# Patient Record
Sex: Male | Born: 1965
Health system: Southern US, Community
[De-identification: ages and names within clinical notes are randomized; demographics above are authoritative.]

## PROBLEM LIST (undated history)

## (undated) DIAGNOSIS — R0981 Nasal congestion: Secondary | ICD-10-CM

## (undated) HISTORY — DX: Other hemochromatosis: E83.118

## (undated) HISTORY — DX: Nasal congestion: R09.81

---

## 2016-02-25 DIAGNOSIS — M25511 Pain in right shoulder: Secondary | ICD-10-CM | POA: Diagnosis not present

## 2016-04-07 DIAGNOSIS — M7541 Impingement syndrome of right shoulder: Secondary | ICD-10-CM | POA: Diagnosis not present

## 2016-04-26 DIAGNOSIS — M25511 Pain in right shoulder: Secondary | ICD-10-CM | POA: Diagnosis not present

## 2016-04-30 DIAGNOSIS — M7541 Impingement syndrome of right shoulder: Secondary | ICD-10-CM | POA: Diagnosis not present

## 2017-04-19 DIAGNOSIS — L0292 Furuncle, unspecified: Secondary | ICD-10-CM | POA: Diagnosis not present

## 2017-04-19 DIAGNOSIS — Z1211 Encounter for screening for malignant neoplasm of colon: Secondary | ICD-10-CM | POA: Diagnosis not present

## 2017-07-06 DIAGNOSIS — J329 Chronic sinusitis, unspecified: Secondary | ICD-10-CM | POA: Diagnosis not present

## 2018-01-04 DIAGNOSIS — D235 Other benign neoplasm of skin of trunk: Secondary | ICD-10-CM | POA: Diagnosis not present

## 2018-01-04 DIAGNOSIS — B078 Other viral warts: Secondary | ICD-10-CM | POA: Diagnosis not present

## 2018-01-04 DIAGNOSIS — D485 Neoplasm of uncertain behavior of skin: Secondary | ICD-10-CM | POA: Diagnosis not present

## 2018-01-04 DIAGNOSIS — L819 Disorder of pigmentation, unspecified: Secondary | ICD-10-CM | POA: Diagnosis not present

## 2018-06-20 DIAGNOSIS — Z1211 Encounter for screening for malignant neoplasm of colon: Secondary | ICD-10-CM | POA: Diagnosis not present

## 2018-06-20 DIAGNOSIS — Z0000 Encounter for general adult medical examination without abnormal findings: Secondary | ICD-10-CM | POA: Diagnosis not present

## 2018-06-20 DIAGNOSIS — Z125 Encounter for screening for malignant neoplasm of prostate: Secondary | ICD-10-CM | POA: Diagnosis not present

## 2018-06-20 DIAGNOSIS — R6882 Decreased libido: Secondary | ICD-10-CM | POA: Diagnosis not present

## 2018-06-29 ENCOUNTER — Other Ambulatory Visit: Payer: Self-pay | Admitting: Family Medicine

## 2018-06-29 DIAGNOSIS — R7989 Other specified abnormal findings of blood chemistry: Secondary | ICD-10-CM

## 2018-06-29 DIAGNOSIS — R945 Abnormal results of liver function studies: Principal | ICD-10-CM

## 2018-07-10 ENCOUNTER — Ambulatory Visit
Admission: RE | Admit: 2018-07-10 | Discharge: 2018-07-10 | Disposition: A | Discharge status: Home / Self Care | Payer: BLUE CROSS/BLUE SHIELD | Source: Ambulatory Visit | Attending: Family Medicine | Admitting: Family Medicine

## 2018-07-10 DIAGNOSIS — R7989 Other specified abnormal findings of blood chemistry: Secondary | ICD-10-CM | POA: Diagnosis not present

## 2018-07-10 DIAGNOSIS — R945 Abnormal results of liver function studies: Principal | ICD-10-CM

## 2018-07-27 DIAGNOSIS — R7989 Other specified abnormal findings of blood chemistry: Secondary | ICD-10-CM | POA: Diagnosis not present

## 2018-07-27 DIAGNOSIS — R6882 Decreased libido: Secondary | ICD-10-CM | POA: Diagnosis not present

## 2018-07-27 DIAGNOSIS — R945 Abnormal results of liver function studies: Secondary | ICD-10-CM | POA: Diagnosis not present

## 2018-07-27 DIAGNOSIS — R7309 Other abnormal glucose: Secondary | ICD-10-CM | POA: Diagnosis not present

## 2018-08-11 ENCOUNTER — Telehealth: Payer: Self-pay | Admitting: Internal Medicine

## 2018-08-11 NOTE — Telephone Encounter (Signed)
Received a hem referral from Dr. London Pepper for eval hemochromatosis. Pt has been cld and scheduled to see Dr. Walden Field on 3/26 at 8:50am. Pt aware to arrive 15 minutes early.

## 2018-08-17 ENCOUNTER — Inpatient Hospital Stay: Payer: BLUE CROSS/BLUE SHIELD

## 2018-08-17 ENCOUNTER — Encounter: Payer: Self-pay | Admitting: Internal Medicine

## 2018-08-17 ENCOUNTER — Telehealth: Payer: Self-pay | Admitting: Internal Medicine

## 2018-08-17 ENCOUNTER — Inpatient Hospital Stay: Payer: BLUE CROSS/BLUE SHIELD | Attending: Internal Medicine | Admitting: Internal Medicine

## 2018-08-17 ENCOUNTER — Other Ambulatory Visit: Payer: Self-pay

## 2018-08-17 VITALS — BP 130/77 | HR 63 | Temp 97.6°F | Resp 17 | Ht 70.5 in | Wt 167.5 lb

## 2018-08-17 DIAGNOSIS — R899 Unspecified abnormal finding in specimens from other organs, systems and tissues: Secondary | ICD-10-CM

## 2018-08-17 DIAGNOSIS — R7989 Other specified abnormal findings of blood chemistry: Secondary | ICD-10-CM

## 2018-08-17 DIAGNOSIS — R0981 Nasal congestion: Secondary | ICD-10-CM | POA: Diagnosis not present

## 2018-08-17 LAB — CBC WITH DIFFERENTIAL (CANCER CENTER ONLY)
Abs Immature Granulocytes: 0.01 10*3/uL (ref 0.00–0.07)
Basophils Absolute: 0 10*3/uL (ref 0.0–0.1)
Basophils Relative: 1 %
Eosinophils Absolute: 0.2 10*3/uL (ref 0.0–0.5)
Eosinophils Relative: 3 %
HCT: 41.4 % (ref 39.0–52.0)
Hemoglobin: 13.9 g/dL (ref 13.0–17.0)
Immature Granulocytes: 0 %
LYMPHS ABS: 1.5 10*3/uL (ref 0.7–4.0)
Lymphocytes Relative: 28 %
MCH: 32.8 pg (ref 26.0–34.0)
MCHC: 33.6 g/dL (ref 30.0–36.0)
MCV: 97.6 fL (ref 80.0–100.0)
Monocytes Absolute: 0.4 10*3/uL (ref 0.1–1.0)
Monocytes Relative: 7 %
NRBC: 0 % (ref 0.0–0.2)
Neutro Abs: 3.2 10*3/uL (ref 1.7–7.7)
Neutrophils Relative %: 61 %
Platelet Count: 168 10*3/uL (ref 150–400)
RBC: 4.24 MIL/uL (ref 4.22–5.81)
RDW: 12.5 % (ref 11.5–15.5)
WBC Count: 5.3 10*3/uL (ref 4.0–10.5)

## 2018-08-17 LAB — CMP (CANCER CENTER ONLY)
ALT: 79 U/L — AB (ref 0–44)
AST: 78 U/L — ABNORMAL HIGH (ref 15–41)
Albumin: 4.4 g/dL (ref 3.5–5.0)
Alkaline Phosphatase: 68 U/L (ref 38–126)
Anion gap: 8 (ref 5–15)
BUN: 19 mg/dL (ref 6–20)
CHLORIDE: 103 mmol/L (ref 98–111)
CO2: 29 mmol/L (ref 22–32)
Calcium: 9.7 mg/dL (ref 8.9–10.3)
Creatinine: 0.79 mg/dL (ref 0.61–1.24)
GFR, Est AFR Am: >60 mL/min (ref >60)
GFR, Estimated: >60 mL/min (ref >60)
Glucose, Bld: 100 mg/dL — ABNORMAL HIGH (ref 70–99)
Potassium: 4.3 mmol/L (ref 3.5–5.1)
SODIUM: 140 mmol/L (ref 135–145)
Total Bilirubin: 1.6 mg/dL — ABNORMAL HIGH (ref 0.3–1.2)
Total Protein: 7.2 g/dL (ref 6.5–8.1)

## 2018-08-17 LAB — IRON AND TIBC
Iron: 234 ug/dL — ABNORMAL HIGH (ref 42–163)
Saturation Ratios: 105 % — ABNORMAL HIGH (ref 20–55)
TIBC: 223 ug/dL (ref 202–409)
UIBC: UNDETERMINED ug/dL (ref 117–376)

## 2018-08-17 LAB — FERRITIN: Ferritin: 5325 ng/mL — ABNORMAL HIGH (ref 24–336)

## 2018-08-17 LAB — LACTATE DEHYDROGENASE: LDH: 178 U/L (ref 98–192)

## 2018-08-17 NOTE — Progress Notes (Signed)
Referring Physician:  [facility] Diagnosis Abnormal laboratory test - Plan: CMP ([place] only), Lactate dehydrogenase (LDH), CBC with Differential ([place] Only), Iron and TIBC, Ferritin, Hemochromatosis DNA, PCR  Sinus congestion  Staging Cancer Staging No matching staging information was found for the patient.  Assessment and Plan:  1.  Abnormal lab studies.  53 year old male referred for evaluation due to elevated LFTs and iron studies.  Pt had labs done 07/27/2018 that showed iron level of 201 and % saturation of 96%.  Hep B and C panels was negative.  Chemistries WNL with Cr 0.88 and K+ 4.2 AST elevated at 88 and ALT elevated at 93.  Occult blood testing negative.  Pt denies any family history of hemochromatosis.  Pt had RUQ USN done 07/10/2018 that showed  IMPRESSION: Normal right upper quadrant ultrasound.  He denies fevers, chills, night sweats, SOB, Headache.  He remains active.  He takes supplements such as B complex and testerone booster.    Pt seen today for consultation due to elevated LFTs and elevated iron studies.    Labs done today in clinic reviewed and showed WBC 5.3 Hb 13.9 plts 168,000.  He has a normal differential.  Chemistries WNL with K+ 4.3 Cr 0.79.  LFTs are elevated with AST of 78 and ALT of 79.  Ferritin elevated at 5325.  TS is 105%.  Awaiting results of hemochromatosis lab evaluation.  If findings are consistent with hemochromatosis, pt will be recommended for Phlebotomy to decrease ferritin level 50-100.  He had recent imaging with USN done in 06/2018 that showed no liver abnormalities.  Discussion of hemachromatosis held with pt and he was provided written information regarding diagnostic evaluation and treatment.  He will be set up for phone meeting in 2 weeks to go over lab results.  Further recommendations will follow once hemochromatosis results reviewed.    2.  Elevated LFTs.  Pt reports he takes Tylenol sinus frequently.   Awaiting results of hemochromatosis labs.  May have to avoid extensive Tylenol use due to potential effects on liver.    3.  Sinus congestion.  Pt reports he takes Tylenol sinus frequently.  May have to avoid extensive Tylenol use due to potential effects on liver.    4.  Health maintenance.  Pt reports he has undergone Colo-guard testing.  Follow-up with PCP as directed.    40 minutes spent with more than 50% spent in review of records, counseling and coordination of care.    HPI:  53 year old male referred for evaluation due to elevated LFTs and iron studies.  Pt had labs done 07/27/2018 that showed iron level of 201 and % saturation of 96%.  Hep B and C panels was negative.  Chemistries WNL with Cr 0.88 and K+ 4.2 AST elevated at 88 and ALT elevated at 93.  Occult blood testing negative.  Pt denies any family history of hemochromatosis.  Pt had RUQ USN done 07/10/2018 that showed  IMPRESSION: Normal right upper quadrant ultrasound.  He denies fevers, chills, night sweats, SOB, Headache.  He remains active.  He takes supplements such as B complex and testerone booster.    Pt seen today for consultation due to elevated LFTs and elevated iron studies.    Problem List Patient Active Problem List   Diagnosis Date Noted  . Sinus congestion [R09.81]     Past Medical History Past Medical History:  Diagnosis Date  . Sinus congestion     Past  Surgical History History reviewed. No pertinent surgical history.  Family History Family History  Problem Relation Age of Onset  . Hemochromatosis Neg Hx      Social History  reports that he has never smoked. He has never used smokeless tobacco. He reports previous alcohol use. He reports previous drug use.  Medications  Current Outpatient Medications:  .  b complex vitamins tablet, Take 1 tablet by mouth daily., Disp: , Rfl:  .  fluticasone (FLONASE) 50 MCG/ACT nasal spray, Place 2 sprays into both nostrils daily., Disp: , Rfl:  .   guaiFENesin (MUCINEX) 600 MG 12 hr tablet, Take 600 mg by mouth 2 (two) times daily., Disp: , Rfl:  .  pseudoephedrine-acetaminophen (TYLENOL SINUS) 30-500 MG TABS tablet, Take 2 tablets by mouth every 4 (four) hours as needed., Disp: , Rfl:   Allergies Patient has no allergy information on record.  Review of Systems Review of Systems - Oncology ROS negative   Physical Exam  Vitals Wt Readings from Last 3 Encounters:  08/17/18 167 lb 8 oz (76 kg)   Temp Readings from Last 3 Encounters:  08/17/18 97.6 F (36.4 C) (Oral)   BP Readings from Last 3 Encounters:  08/17/18 130/77   Pulse Readings from Last 3 Encounters:  08/17/18 63   Constitutional: Well-developed, well-nourished, and in no distress.   HENT: Head: Normocephalic and atraumatic.  Mouth/Throat: No oropharyngeal exudate. Mucosa moist. Eyes: Pupils are equal, round, and reactive to light. Conjunctivae are normal. No scleral icterus.  Neck: Normal range of motion. Neck supple. No JVD present.  Cardiovascular: Normal rate, regular rhythm and normal heart sounds.  Exam reveals no gallop and no friction rub.   No murmur heard. Pulmonary/Chest: Effort normal and breath sounds normal. No respiratory distress. No wheezes.No rales.  Abdominal: Soft. Bowel sounds are normal. No distension. There is no tenderness. There is no guarding.  Musculoskeletal: No edema or tenderness.  Lymphadenopathy: No cervical,axillary or supraclavicular adenopathy.  Neurological: Alert and oriented to person, place, and time. No cranial nerve deficit.  Skin: Skin is warm and dry. No rash noted. No erythema. No pallor.  Psychiatric: Affect and judgment normal.   Labs Appointment on 08/17/2018  Component Date Value Ref Range Status  . Ferritin 08/17/2018 5,325* 24 - 336 ng/mL Final   Performed at [facility], [place] [address], [place], [place] [address]  . Iron 08/17/2018 234* 42 - 163 ug/dL Final  . TIBC 08/17/2018 223   202 - 409 ug/dL Final  . Saturation Ratios 08/17/2018 105* 20 - 55 % Final  . UIBC 08/17/2018 UNABLE TO CALCULATE  117 - 376 ug/dL Final   Performed at [facility], [place] [address], [place],  [address]  . WBC Count 08/17/2018 5.3  4.0 - 10.5 K/uL Final  . RBC 08/17/2018 4.24  4.22 - 5.81 MIL/uL Final  . Hemoglobin 08/17/2018 13.9  13.0 - 17.0 g/dL Final  . HCT 08/17/2018 41.4  39.0 - 52.0 % Final  . MCV 08/17/2018 97.6  80.0 - 100.0 fL Final  . MCH 08/17/2018 32.8  26.0 - 34.0 pg Final  . MCHC 08/17/2018 33.6  30.0 - 36.0 g/dL Final  . RDW 08/17/2018 12.5  11.5 - 15.5 % Final  . Platelet Count 08/17/2018 168  150 - 400 K/uL Final  . nRBC 08/17/2018 0.0  0.0 - 0.2 % Final  . Neutrophils Relative % 08/17/2018 61  % Final  . Neutro Abs 08/17/2018 3.2  1.7 - 7.7 K/uL Final  . Lymphocytes Relative 08/17/2018 28  % Final  . Lymphs Abs 08/17/2018 1.5  0.7 - 4.0 K/uL Final  . Monocytes Relative 08/17/2018 7  % Final  . Monocytes Absolute 08/17/2018 0.4  0.1 - 1.0 K/uL Final  . Eosinophils Relative 08/17/2018 3  % Final  . Eosinophils Absolute 08/17/2018 0.2  0.0 - 0.5 K/uL Final  . Basophils Relative 08/17/2018 1  % Final  . Basophils Absolute 08/17/2018 0.0  0.0 - 0.1 K/uL Final  . Immature Granulocytes 08/17/2018 0  % Final  . Abs Immature Granulocytes 08/17/2018 0.01  0.00 - 0.07 K/uL Final   Performed at [facility], [place] [address], [place], [place] [address]  . LDH 08/17/2018 178  98 - 192 U/L Final   Performed at [facility], [place] [address], [place], [place] [address]  . Sodium 08/17/2018 140  135 - 145 mmol/L Final  . Potassium 08/17/2018 4.3  3.5 - 5.1 mmol/L Final  . Chloride 08/17/2018 103  98 - 111 mmol/L Final  . CO2 08/17/2018 29  22 - 32 mmol/L Final  . Glucose, Bld 08/17/2018 100* 70 - 99 mg/dL Final  . BUN 08/17/2018 19  6 - 20 mg/dL Final  . Creatinine 08/17/2018 0.79  0.61 - 1.24 mg/dL  Final  . Calcium 08/17/2018 9.7  8.9 - 10.3 mg/dL Final  . Total Protein 08/17/2018 7.2  6.5 - 8.1 g/dL Final  . Albumin 08/17/2018 4.4  3.5 - 5.0 g/dL Final  . AST 08/17/2018 78* 15 - 41 U/L Final  . ALT 08/17/2018 79* 0 - 44 U/L Final  . Alkaline Phosphatase 08/17/2018 68  38 - 126 U/L Final  . Total Bilirubin 08/17/2018 1.6* 0.3 - 1.2 mg/dL Final  . GFR, Est Non Af Am 08/17/2018 >60  >60 mL/min Final  . GFR, Est AFR Am 08/17/2018 >60  >60 mL/min Final  . Anion gap 08/17/2018 8  5 - 15 Final   Performed at [facility], [place] [address], [place],  [address]     Pathology Orders Placed This Encounter  Procedures  . CMP ([place] only)    Standing Status:   Future    Number of Occurrences:   1    Standing Expiration Date:   08/17/2019  . Lactate dehydrogenase (LDH)    Standing Status:   Future    Number of Occurrences:   1    Standing Expiration Date:   08/17/2019  . CBC with Differential ([facility] Only)    Standing Status:   Future    Number of Occurrences:   1    Standing Expiration Date:   08/17/2019  . Iron and TIBC    Standing Status:   Future    Number of Occurrences:   1    Standing Expiration Date:   08/17/2019  . Ferritin    Standing Status:   Future    Number of Occurrences:   1    Standing Expiration Date:   08/17/2019  . Hemochromatosis DNA, PCR    Standing Status:   Future    Number of Occurrences:   1    Standing Expiration Date:   08/17/2019       Zoila Shutter MD

## 2018-08-17 NOTE — Telephone Encounter (Signed)
Scheduled appt per 3/26 los.  Virtual visit was not an option in the appt type anymore.  Put est for the visit type and typed in the notes that it was a phone visit.

## 2018-08-23 LAB — HEMOCHROMATOSIS DNA-PCR(C282Y,H63D)

## 2018-08-31 ENCOUNTER — Ambulatory Visit: Payer: BLUE CROSS/BLUE SHIELD | Admitting: Internal Medicine

## 2018-08-31 ENCOUNTER — Inpatient Hospital Stay: Payer: BLUE CROSS/BLUE SHIELD | Attending: Internal Medicine | Admitting: Internal Medicine

## 2018-08-31 DIAGNOSIS — R0981 Nasal congestion: Secondary | ICD-10-CM | POA: Insufficient documentation

## 2018-08-31 NOTE — Progress Notes (Signed)
Virtual Visit via Telephone Note  I connected with Keith Evans on 08/31/18 at  9:50 AM EDT by telephone and verified that I am speaking with the correct person using two identifiers.   I discussed the limitations, risks, security and privacy concerns of performing an evaluation and management service by telephone and the availability of in person appointments. I also discussed with the patient that there may be a patient responsible charge related to this service. The patient expressed understanding and agreed to proceed.   Interval History: Historical data obtained from note dated 08/17/2018  53 year old male referred for evaluation due to elevated LFTs and iron studies.  Pt had labs done 07/27/2018 that showed iron level of 201 and % saturation of 96%.  Hep B and C panels was negative.  Chemistries WNL with Cr 0.88 and K+ 4.2 AST elevated at 88 and ALT elevated at 93.  Occult blood testing negative.  Pt denies any family history of hemochromatosis.  Pt had RUQ USN done 07/10/2018 that showed  IMPRESSION: Normal right upper quadrant ultrasound.  He denies fevers, chills, night sweats, SOB, Headache.  He remains active.  He takes supplements such as B complex and testerone booster.  Pt denies family history of hemochromatosis.  He reports father was told he had NASH.     Observations/Objective:  Review labs from 08/17/2018   Assessment and Plan:1.  Hemochromatosis, C282Y homozygote.  53 year old male referred for evaluation due to elevated LFTs and iron studies.  Pt had labs done 07/27/2018 that showed iron level of 201 and % saturation of 96%.  Hep B and C panels was negative.  Chemistries WNL with Cr 0.88 and K+ 4.2 AST elevated at 88 and ALT elevated at 93.  Occult blood testing negative.  Pt denies any family history of hemochromatosis.  Pt had RUQ USN done 07/10/2018 that showed  IMPRESSION: Normal right upper quadrant ultrasound.  He denies fevers, chills, night sweats, SOB, Headache.  He  remains active.  He takes supplements such as B complex and testerone booster.    Pt seen today for consultation due to elevated LFTs and elevated iron studies.    Labs done 08/17/2018  reviewed and showed WBC 5.3 Hb 13.9 plts 168,000.  He has a normal differential.  Chemistries WNL with K+ 4.3 Cr 0.79.  LFTs are elevated with AST of 78 and ALT of 79.  Ferritin elevated at 5325.  TS is 105%.  Hemochromatosis gene evaluation returned  Result: AFFECTED  Two copies of the same mutation (C282Y and C282Y) identified  Interpretation:  Results for H63D and S65C were negative.   Based on the pt being homozygous for C282Y and with ferritin level of greater than 5000, he is recommended for Phlebotomy to decrease ferritin level 50-100.  He had recent imaging with USN done in 06/2018 that showed no liver abnormalities.  Previously, I discussed diagnosis and potential clinical implications of hemachromatosis.  Pt was also provided written information.  Pt is set up for 1 unit (500 ML) phlebotomy weekly for 4 weeks.  He will have labs repeated in May 2020.  Pt reports he has a brother and he is advised to have him get iron studies for testing.  Pt reports no known family history of hemochromatosis but does report his father reportedly had NASH.    2.  Elevated LFTs. Pt is homozygous for hemochromatosis.  Recent USN of abdomen done 06/2018 showed no liver abnormalities.   Pt reports  he takes Tylenol sinus frequently. May have to avoid extensive Tylenol use due to potential effects on liver.  Pt advised to avoid extensive supplement use.  Will repeat labs in 09/2018 for ongoing follow-up.    3.  Family history of NASH.  Pt reports his father reportedly was diagnosed with this.  He reports he died > 44 yoa..    4.  Sinus congestion.  Pt reports he takes Tylenol sinus frequently.  May have to avoid extensive Tylenol use due to potential effects on liver.    5.  Health maintenance.  Pt reports he has undergone  Colo-guard testing.  Follow-up with PCP as directed.    Follow Up Instructions:   Weekly 1 unit phlebotomy for 4 weeks.  Labs in 09/2018 and follow-up at that time.    I discussed the assessment and treatment plan with the patient. The patient was provided an opportunity to ask questions and all were answered. The patient agreed with the plan and demonstrated an understanding of the instructions.   The patient was advised to call back or seek an in-person evaluation if the symptoms worsen or if the condition fails to improve as anticipated.  I provided 15 minutes of non-face-to-face time during this encounter.   Zoila Shutter, MD

## 2018-09-01 ENCOUNTER — Telehealth: Payer: Self-pay | Admitting: Internal Medicine

## 2018-09-01 NOTE — Telephone Encounter (Signed)
Called regarding 4/16 °

## 2018-09-07 ENCOUNTER — Other Ambulatory Visit: Payer: Self-pay | Admitting: Internal Medicine

## 2018-09-07 ENCOUNTER — Inpatient Hospital Stay: Payer: BLUE CROSS/BLUE SHIELD

## 2018-09-07 ENCOUNTER — Other Ambulatory Visit: Payer: Self-pay

## 2018-09-07 ENCOUNTER — Encounter: Payer: Self-pay | Admitting: Internal Medicine

## 2018-09-07 DIAGNOSIS — R0981 Nasal congestion: Secondary | ICD-10-CM | POA: Diagnosis not present

## 2018-09-07 HISTORY — DX: Other hemochromatosis: E83.118

## 2018-09-07 NOTE — Progress Notes (Signed)
Pt in for phlebotomy. 18 G needle with secondary tubing kit used.  Start time of [phone] and end time of 906. 514cc removed. Pt tolerated well. Monitored for 30 minutes post procedure, and VSS upon leaving unit.  Pt provided with education on post phlebotomy care and given handout.

## 2018-09-07 NOTE — Patient Instructions (Signed)

## 2018-09-14 ENCOUNTER — Other Ambulatory Visit: Payer: Self-pay

## 2018-09-14 ENCOUNTER — Inpatient Hospital Stay: Payer: BLUE CROSS/BLUE SHIELD

## 2018-09-14 DIAGNOSIS — R0981 Nasal congestion: Secondary | ICD-10-CM | POA: Diagnosis not present

## 2018-09-14 NOTE — Patient Instructions (Signed)
Coronavirus (COVID-19) Are you at risk?  Are you at risk for the Coronavirus (COVID-19)?  To be considered HIGH RISK for Coronavirus (COVID-19), you have to meet the following criteria:  . Traveled to [place], [place], [place], [place] or [place]; or in the [place] to [place], [place], [place], or [place]; and have fever, cough, and shortness of breath within the last 2 weeks of travel OR . Been in close contact with a person diagnosed with COVID-19 within the last 2 weeks and have fever, cough, and shortness of breath . IF YOU DO NOT MEET THESE CRITERIA, YOU ARE CONSIDERED LOW RISK FOR COVID-19.  What to do if you are HIGH RISK for COVID-19?  . If you are having a medical emergency, call 911. . Seek medical care right away. Before you go to a doctor's office, urgent care or emergency department, call ahead and tell them about your recent travel, contact with someone diagnosed with COVID-19, and your symptoms. You should receive instructions from your physician's office regarding next steps of care.  . When you arrive at healthcare provider, tell the healthcare staff immediately you have returned from visiting [place], [place], [place], [place] or [place]; or traveled in the [place] to [place], [place], [place], or [place]; in the last two weeks or you have been in close contact with a person diagnosed with COVID-19 in the last 2 weeks.   . Tell the health care staff about your symptoms: fever, cough and shortness of breath. . After you have been seen by a medical provider, you will be either: o Tested for (COVID-19) and discharged home on quarantine except to seek medical care if symptoms worsen, and asked to  - Stay home and avoid contact with others until you get your results (4-5 days)  - Avoid travel on public transportation if possible (such as bus, train, or airplane) or o Sent to the Emergency Department by EMS for evaluation, COVID-19 testing, and possible  admission depending on your condition and test results.  What to do if you are LOW RISK for COVID-19?  Reduce your risk of any infection by using the same precautions used for avoiding the common cold or flu:  . Wash your hands often with soap and warm water for at least 20 seconds.  If soap and water are not readily available, use an alcohol-based hand sanitizer with at least 60% alcohol.  . If coughing or sneezing, cover your mouth and nose by coughing or sneezing into the elbow areas of your shirt or coat, into a tissue or into your sleeve (not your hands). . Avoid shaking hands with others and consider head nods or verbal greetings only. . Avoid touching your eyes, nose, or mouth with unwashed hands.  . Avoid close contact with people who are sick. . Avoid places or events with large numbers of people in one location, like concerts or sporting events. . Carefully consider travel plans you have or are making. . If you are planning any travel outside or inside the [place], visit the CDC's Travelers' Health webpage for the latest health notices. . If you have some symptoms but not all symptoms, continue to monitor at home and seek medical attention if your symptoms worsen. . If you are having a medical emergency, call 911.   ADDITIONAL HEALTHCARE OPTIONS FOR PATIENTS  [facility] / e-Visit: [email or web address]         [facility]: [phone]  Onset   Urgent Care: [facility]: [phone]   Therapeutic Phlebotomy, Care After This sheet gives you information about how to care for yourself after your procedure. Your health care provider may also give you more specific instructions. If you have problems or questions, contact your health care provider. What can I expect after the procedure? After the procedure, it is common to have:  Light-headedness or dizziness. You may feel  faint.  Nausea.  Tiredness (fatigue). Follow these instructions at home: Eating and drinking  Be sure to eat well-balanced meals for the next 24 hours.  Drink enough fluid to keep your urine pale yellow.  Avoid drinking alcohol on the day that you had the procedure. Activity   Return to your normal activities as told by your health care provider. Most people can go back to their normal activities right away.  Avoid activities that take a lot of effort for about 5 hours after the procedure. Athletes should avoid strenuous exercise for at least 12 hours.  Avoid heavy lifting or pulling for about 5 hours after the procedure. Do not lift anything that is heavier than 10 lb (4.5 kg).  Change positions slowly for the remainder of the day. This will help to prevent light-headedness or fainting.  If you feel light-headed, lie down until the feeling goes away. Needle insertion site care   Keep your bandage (dressing) dry. You can remove the bandage after about 5 hours or as told by your health care provider.  If you have bleeding from the needle insertion site, raise (elevate) your arm and press firmly on the site until the bleeding stops.  If you have bruising at the site, apply ice to the area: ? Remove the dressing. ? Put ice in a plastic bag. ? Place a towel between your skin and the bag. ? Leave the ice on for 20 minutes, 2-3 times a day for the first 24 hours.  If the swelling does not go away after 24 hours, apply a warm, moist cloth (warm compress) to the area for 20 minutes, 2-3 times a day. General instructions  Do not use any products that contain nicotine or tobacco, such as cigarettes and e-cigarettes, for at least 30 minutes after the procedure.  Keep all follow-up visits as told by your health care provider. This is important. You may need to continue having regular therapeutic phlebotomy treatments as directed. Contact a health care provider if you:  Have redness,  swelling, or pain at the needle insertion site.  Have fluid or blood coming from the needle insertion site.  Have pus or a bad smell coming from the needle insertion site.  Notice that the needle insertion site feels warm to the touch.  Feel light-headed, dizzy, or nauseous, and the feeling does not go away.  Have new bruising at the needle insertion site.  Feel weaker than normal.  Have a fever or chills. Get help right away if:  You faint.  You have chest pain.  You have trouble breathing.  You have severe nausea or vomiting. Summary  After the procedure, it is common to have some light-headedness, dizziness, nausea, or tiredness (fatigue).  Be sure to eat well-balanced meals for the next 24 hours. Drink enough fluid to keep your urine pale yellow.  Return to your normal activities as told by your health care provider.  Keep all follow-up visits as  told by your health care provider. You may need to continue having regular therapeutic phlebotomy treatments as directed. This information is not intended to replace advice given to you by your health care provider. Make sure you discuss any questions you have with your health care provider. Document Released: 10/12/2010 Document Revised: 05/26/2017 Document Reviewed: 05/26/2017 Elsevier Interactive Patient Education  2019 [employer].

## 2018-09-14 NOTE — Progress Notes (Signed)
Keith Evans presents today for phlebotomy per MD orders. LFA accessed with 20G needle and secondary set by Amy S-RN (unable to get good blood flow from RAC with phlebotomy kit or LW with 18G). Phlebotomy procedure started at 08:51 and ended at 09:12. 506 grams removed. IV needle removed intact. Patient tolerated procedure well. Snack and drink provided and patient observed for 30 minutes after procedure. VSS and patient ambulated out of clinic without any incident.

## 2018-09-21 ENCOUNTER — Inpatient Hospital Stay: Payer: BLUE CROSS/BLUE SHIELD

## 2018-09-21 ENCOUNTER — Other Ambulatory Visit: Payer: Self-pay

## 2018-09-21 DIAGNOSIS — R0981 Nasal congestion: Secondary | ICD-10-CM | POA: Diagnosis not present

## 2018-09-21 NOTE — Progress Notes (Signed)
Therapeutic phlebotomy performed per Dr. Walden Field orders. Started at [phone] and ended at 28. 18 gauge needle used x 1 attempt left anterior forearm. Patient tolerated well. 500 grams removed. Patient offered beverage and snacks and accepted beverage. 4x4 and coban applied. No active bleeding noted.

## 2018-09-21 NOTE — Patient Instructions (Signed)
Therapeutic Phlebotomy Therapeutic phlebotomy is the planned removal of blood from a person's body for the purpose of treating a medical condition. The procedure is similar to donating blood. Usually, about a pint (470 mL, or 0.47 L) of blood is removed. The average adult has 9-12 pints (4.3-5.7 L) of blood in the body. Therapeutic phlebotomy may be used to treat the following medical conditions:  Hemochromatosis. This is a condition in which the blood contains too much iron.  Polycythemia vera. This is a condition in which the blood contains too many red blood cells.  Porphyria cutanea tarda. This is a disease in which an important part of hemoglobin is not made properly. It results in the buildup of abnormal amounts of porphyrins in the body.  Sickle cell disease. This is a condition in which the red blood cells form an abnormal crescent shape rather than a round shape. Tell a health care provider about:  Any allergies you have.  All medicines you are taking, including vitamins, herbs, eye drops, creams, and over-the-counter medicines.  Any problems you or family members have had with anesthetic medicines.  Any blood disorders you have.  Any surgeries you have had.  Any medical conditions you have.  Whether you are pregnant or may be pregnant. What are the risks? Generally, this is a safe procedure. However, problems may occur, including:  Nausea or light-headedness.  Low blood pressure (hypotension).  Soreness, bleeding, swelling, or bruising at the needle insertion site.  Infection. What happens before the procedure?  Follow instructions from your health care provider about eating or drinking restrictions.  Ask your health care provider about: ? Changing or stopping your regular medicines. This is especially important if you are taking diabetes medicines or blood thinners (anticoagulants). ? Taking medicines such as aspirin and ibuprofen. These medicines can thin your  blood. Do not take these medicines unless your health care provider tells you to take them. ? Taking over-the-counter medicines, vitamins, herbs, and supplements.  Wear clothing with sleeves that can be raised above the elbow.  Plan to have someone take you home from the hospital or clinic.  You may have a blood sample taken.  Your blood pressure, pulse rate, and breathing rate will be measured. What happens during the procedure?   To lower your risk of infection: ? Your health care team will wash or sanitize their hands. ? Your skin will be cleaned with an antiseptic.  You may be given a medicine to numb the area (local anesthetic).  A tourniquet will be placed on your arm.  A needle will be inserted into one of your veins.  Tubing and a collection bag will be attached to that needle.  Blood will flow through the needle and tubing into the collection bag.  The collection bag will be placed lower than your arm to allow gravity to help the flow of blood into the bag.  You may be asked to open and close your hand slowly and continually during the entire collection.  After the specified amount of blood has been removed from your body, the collection bag and tubing will be clamped.  The needle will be removed from your vein.  Pressure will be held on the site of the needle insertion to stop the bleeding.  A bandage (dressing) will be placed over the needle insertion site. The procedure may vary among health care providers and hospitals. What happens after the procedure?  Your blood pressure, pulse rate, and breathing rate will be   measured after the procedure.  You will be encouraged to drink fluids.  Your recovery will be assessed and monitored.  You can return to your normal activities as told by your health care provider. Summary  Therapeutic phlebotomy is the planned removal of blood from a person's body for the purpose of treating a medical condition.  Therapeutic  phlebotomy may be used to treat hemochromatosis, polycythemia vera, porphyria cutanea tarda, or sickle cell disease.  In the procedure, a needle is inserted and about a pint (470 mL, or 0.47 L) of blood is removed. The average adult has 9-12 pints (4.3-5.7 L) of blood in the body.  This is generally a safe procedure, but it can sometimes cause problems such as nausea, light-headedness, or low blood pressure (hypotension). This information is not intended to replace advice given to you by your health care provider. Make sure you discuss any questions you have with your health care provider. Document Released: 10/12/2010 Document Revised: 05/26/2017 Document Reviewed: 05/26/2017 Elsevier Interactive Patient Education  2019 Elsevier Inc.  

## 2018-09-28 ENCOUNTER — Inpatient Hospital Stay: Payer: BLUE CROSS/BLUE SHIELD | Attending: Internal Medicine

## 2018-09-28 ENCOUNTER — Other Ambulatory Visit: Payer: Self-pay

## 2018-09-28 ENCOUNTER — Inpatient Hospital Stay (HOSPITAL_BASED_OUTPATIENT_CLINIC_OR_DEPARTMENT_OTHER): Payer: BLUE CROSS/BLUE SHIELD | Admitting: Internal Medicine

## 2018-09-28 ENCOUNTER — Telehealth: Payer: Self-pay | Admitting: Internal Medicine

## 2018-09-28 ENCOUNTER — Inpatient Hospital Stay: Payer: BLUE CROSS/BLUE SHIELD

## 2018-09-28 DIAGNOSIS — R0981 Nasal congestion: Secondary | ICD-10-CM | POA: Diagnosis not present

## 2018-09-28 LAB — CBC WITH DIFFERENTIAL (CANCER CENTER ONLY)
Abs Immature Granulocytes: 0 10*3/uL (ref 0.00–0.07)
Basophils Absolute: 0.1 10*3/uL (ref 0.0–0.1)
Basophils Relative: 1 %
Eosinophils Absolute: 0.2 10*3/uL (ref 0.0–0.5)
Eosinophils Relative: 4 %
HCT: 38.6 % — ABNORMAL LOW (ref 39.0–52.0)
Hemoglobin: 13 g/dL (ref 13.0–17.0)
Immature Granulocytes: 0 %
Lymphocytes Relative: 28 %
Lymphs Abs: 1.4 10*3/uL (ref 0.7–4.0)
MCH: 33.2 pg (ref 26.0–34.0)
MCHC: 33.7 g/dL (ref 30.0–36.0)
MCV: 98.5 fL (ref 80.0–100.0)
Monocytes Absolute: 0.4 10*3/uL (ref 0.1–1.0)
Monocytes Relative: 8 %
Neutro Abs: 3 10*3/uL (ref 1.7–7.7)
Neutrophils Relative %: 59 %
Platelet Count: 184 10*3/uL (ref 150–400)
RBC: 3.92 MIL/uL — ABNORMAL LOW (ref 4.22–5.81)
RDW: 13.6 % (ref 11.5–15.5)
WBC Count: 5 10*3/uL (ref 4.0–10.5)
nRBC: 0 % (ref 0.0–0.2)

## 2018-09-28 LAB — CMP (CANCER CENTER ONLY)
ALT: 88 U/L — ABNORMAL HIGH (ref 0–44)
AST: 82 U/L — ABNORMAL HIGH (ref 15–41)
Albumin: 4.3 g/dL (ref 3.5–5.0)
Alkaline Phosphatase: 61 U/L (ref 38–126)
Anion gap: 9 (ref 5–15)
BUN: 18 mg/dL (ref 6–20)
CO2: 27 mmol/L (ref 22–32)
Calcium: 9.6 mg/dL (ref 8.9–10.3)
Chloride: 104 mmol/L (ref 98–111)
Creatinine: 0.88 mg/dL (ref 0.61–1.24)
GFR, Est AFR Am: >60 mL/min (ref >60)
GFR, Estimated: >60 mL/min (ref >60)
Glucose, Bld: 100 mg/dL — ABNORMAL HIGH (ref 70–99)
Potassium: 4.1 mmol/L (ref 3.5–5.1)
Sodium: 140 mmol/L (ref 135–145)
Total Bilirubin: 1.1 mg/dL (ref 0.3–1.2)
Total Protein: 6.9 g/dL (ref 6.5–8.1)

## 2018-09-28 LAB — LACTATE DEHYDROGENASE: LDH: 171 U/L (ref 98–192)

## 2018-09-28 LAB — IRON AND TIBC
Iron: 249 ug/dL — ABNORMAL HIGH (ref 42–163)
Saturation Ratios: 102 % — ABNORMAL HIGH (ref 20–55)
TIBC: 243 ug/dL (ref 202–409)
UIBC: UNDETERMINED ug/dL (ref 117–376)

## 2018-09-28 LAB — FERRITIN: Ferritin: 4870 ng/mL — ABNORMAL HIGH (ref 24–336)

## 2018-09-28 NOTE — Telephone Encounter (Signed)
Scheduled appt per 5/07 sch message - pt is aware of appt date and time   

## 2018-09-28 NOTE — Progress Notes (Signed)
Diagnosis Hereditary hemochromatosis ([place]) - Plan: CBC with Differential ([facility] Only), CMP ([facility] only), Lactate dehydrogenase (LDH), Ferritin, Iron and TIBC  Staging Cancer Staging No matching staging information was found for the patient.  Assessment and Plan:  1.  Hemochromatosis, C282Y homozygote.  53 year old male referred for evaluation due to elevated LFTs and iron studies.  Pt had labs done 07/27/2018 that showed iron level of 201 and % saturation of 96%.  Hep B and C panels was negative.  Chemistries WNL with Cr 0.88 and K+ 4.2 AST elevated at 88 and ALT elevated at 93.  Occult blood testing negative.  Pt denies any family history of hemochromatosis.  Pt had RUQ USN done 07/10/2018 that showed  IMPRESSION: Normal right upper quadrant ultrasound.  He denies fevers, chills, night sweats, SOB, Headache.  He remains active.  He takes supplements such as B complex and testerone booster.    Labs done 08/17/2018 showed WBC 5.3 Hb 13.9 plts 168,000.  He has a normal differential.  Chemistries WNL with K+ 4.3 Cr 0.79.  LFTs are elevated with AST of 78 and ALT of 79.  Ferritin elevated at 5325.  TS is 105%.  Hemochromatosis gene evaluation returned  Result: AFFECTED  Two copies of the same mutation (C282Y and C282Y) identified  Interpretation:  Results for H63D and S65C were negative.   Based on the pt being homozygous for C282Y and with ferritin level of greater than 5000, he is recommended for Phlebotomy to decrease ferritin level 50-100.  He had recent imaging with USN done in 06/2018 that showed no liver abnormalities.  Previously, I discussed diagnosis and potential clinical implications of hemachromatosis.  Pt was also provided written information.    Labs done today 09/28/2018 reviewed and showed WBC 5 HB 13 plts 184,000.  Chemistries showed K+ 4.1 Cr 0.88 AST 82 and ALT 88.  Ferritin 4870.  He has undergone 3 phlebotomies.  He reported initially not tolerating procedure.  He  is wondering about medications that can be used instead.  I discussed with him standard recommendations are for consistent phlebotomy to lower Iron levels and likely once levels have improved frequency of phlebotomy will decrease pending iron levels.  Iron chelator medications are not recommended for pts with Hemochromatosis and are indicated for pts with iron overload due to transfusion therapy or hemoglobinopathies.  These medications also have potential side effects.  He will be set up for 1 unit phlebotomy ( 500 ML) every 2 weeks to assess for tolerance of procedure.  He will have repeat labs done in 10/2018.  I previously discussed with pt his brother should get iron studies for testing.  Pt reports no known family history of hemochromatosis but does report his father reportedly had NASH.    2.  Elevated LFTs. Pt is homozygous for hemochromatosis.  USN of abdomen done 06/2018 showed no liver abnormalities.   Pt reports he takes Tylenol sinus frequently. Have advised pt to avoid extensive Tylenol use due to potential effects on liver.  Pt advised to avoid extensive supplement use.  Chemistries done today 09/28/2018 showed AST of 82 and ALT of 88.  Will repeat labs in 10/2018 for ongoing follow-up.    3.  Family history of NASH.  Pt reports his father reportedly was diagnosed with this.  He reports he died > 21 yoa..    4.  Sinus congestion.  Pt reports he takes Tylenol sinus frequently.  Have advised pt to avoid extensive Tylenol  use due to potential effects on liver.    5.  Health maintenance.  Pt reports he has undergone Colo-guard testing.  Follow-up with PCP as directed.    25 minutes spent with more than 50% spent in counseling and coordination of care.    Interval History:  Historical data obtained from note dated 08/17/2018  53 year old male referred for evaluation due to elevated LFTs and iron studies.  Pt had labs done 07/27/2018 that showed iron level of 201 and % saturation of 96%.  Hep B and C  panels was negative.  Chemistries WNL with Cr 0.88 and K+ 4.2 AST elevated at 88 and ALT elevated at 93.  Occult blood testing negative.  Pt denies any family history of hemochromatosis.  Pt had RUQ USN done 07/10/2018 that showed  IMPRESSION: Normal right upper quadrant ultrasound.  He denies fevers, chills, night sweats, SOB, Headache.  He remains active.  He takes supplements such as B complex and testerone booster.  Pt denies family history of hemochromatosis.  He reports father was told he had NASH.    Current Status:  Pt seen today for follow-up to go over labs.  He reports some problems tolerating phlebotomy and is wondering if medication can be used instead. He reports improved joint pain.    Problem List Patient Active Problem List   Diagnosis Date Noted  . Other hemochromatosis [E83.118] 09/07/2018  . Sinus congestion [R09.81]     Past Medical History Past Medical History:  Diagnosis Date  . Other hemochromatosis 09/07/2018  . Sinus congestion     Past Surgical History No past surgical history on file.  Family History Family History  Problem Relation Age of Onset  . Hemochromatosis Neg Hx      Social History  reports that he has never smoked. He has never used smokeless tobacco. He reports previous alcohol use. He reports previous drug use.  Medications  Current Outpatient Medications:  .  fluticasone (FLONASE) 50 MCG/ACT nasal spray, Place 2 sprays into both nostrils as needed. , Disp: , Rfl:  .  guaiFENesin (MUCINEX) 600 MG 12 hr tablet, Take 600 mg by mouth as needed. , Disp: , Rfl:  .  pseudoephedrine-acetaminophen (TYLENOL SINUS) 30-500 MG TABS tablet, Take 2 tablets by mouth every 4 (four) hours as needed., Disp: , Rfl:  .  b complex vitamins tablet, Take 1 tablet by mouth daily., Disp: , Rfl:   Allergies Patient has no allergy information on record.  Review of Systems Review of Systems - Oncology ROS negative other than improved joint pain.      Physical Exam  Vitals Wt Readings from Last 3 Encounters:  09/28/18 168 lb (76.2 kg)  08/17/18 167 lb 8 oz (76 kg)   Temp Readings from Last 3 Encounters:  09/28/18 97.8 F (36.6 C) (Oral)  09/21/18 97.7 F (36.5 C) (Oral)  09/14/18 97.8 F (36.6 C) (Oral)   BP Readings from Last 3 Encounters:  09/28/18 134/78  09/21/18 126/75  09/14/18 119/69   Pulse Readings from Last 3 Encounters:  09/28/18 66  09/21/18 72  09/14/18 62   Constitutional: Well-developed, well-nourished, and in no distress.   HENT: Head: Normocephalic and atraumatic.  Mouth/Throat: No oropharyngeal exudate. Mucosa moist. Eyes: Pupils are equal, round, and reactive to light. Conjunctivae are normal. No scleral icterus.  Neck: Normal range of motion. Neck supple. No JVD present.  Cardiovascular: Normal rate, regular rhythm and normal heart sounds.  Exam reveals no gallop and no  friction rub.   No murmur heard. Pulmonary/Chest: Effort normal and breath sounds normal. No respiratory distress. No wheezes.No rales.  Abdominal: Soft. Bowel sounds are normal. No distension. There is no tenderness. There is no guarding.  Musculoskeletal: No edema or tenderness.  Lymphadenopathy: No cervical, axillary or supraclavicular adenopathy.  Neurological: Alert and oriented to person, place, and time. No cranial nerve deficit.  Skin: Skin is warm and dry. No rash noted. No erythema. No pallor.  Psychiatric: Affect and judgment normal.   Labs Appointment on 09/28/2018  Component Date Value Ref Range Status  . Iron 09/28/2018 249* 42 - 163 ug/dL Final  . TIBC 09/28/2018 243  202 - 409 ug/dL Final  . Saturation Ratios 09/28/2018 102* 20 - 55 % Final  . UIBC 09/28/2018 UNABLE TO CALCULATE  117 - 376 ug/dL Final   Performed at [facility], [place] [address], [place], [place] [address]  . Ferritin 09/28/2018 4,870* 24 - 336 ng/mL Final   Performed at [facility], [place]  [address] [place], [place] [address]  . LDH 09/28/2018 171  98 - 192 U/L Final   Performed at [facility], [place] [address], [place], [place] [address]  . Sodium 09/28/2018 140  135 - 145 mmol/L Final  . Potassium 09/28/2018 4.1  3.5 - 5.1 mmol/L Final  . Chloride 09/28/2018 104  98 - 111 mmol/L Final  . CO2 09/28/2018 27  22 - 32 mmol/L Final  . Glucose, Bld 09/28/2018 100* 70 - 99 mg/dL Final  . BUN 09/28/2018 18  6 - 20 mg/dL Final  . Creatinine 09/28/2018 0.88  0.61 - 1.24 mg/dL Final  . Calcium 09/28/2018 9.6  8.9 - 10.3 mg/dL Final  . Total Protein 09/28/2018 6.9  6.5 - 8.1 g/dL Final  . Albumin 09/28/2018 4.3  3.5 - 5.0 g/dL Final  . AST 09/28/2018 82* 15 - 41 U/L Final  . ALT 09/28/2018 88* 0 - 44 U/L Final  . Alkaline Phosphatase 09/28/2018 61  38 - 126 U/L Final  . Total Bilirubin 09/28/2018 1.1  0.3 - 1.2 mg/dL Final  . GFR, Est Non Af Am 09/28/2018 >60  >60 mL/min Final  . GFR, Est AFR Am 09/28/2018 >60  >60 mL/min Final  . Anion gap 09/28/2018 9  5 - 15 Final   Performed at [facility], [place] [address] [place], [place] [address]  . WBC Count 09/28/2018 5.0  4.0 - 10.5 K/uL Final  . RBC 09/28/2018 3.92* 4.22 - 5.81 MIL/uL Final  . Hemoglobin 09/28/2018 13.0  13.0 - 17.0 g/dL Final  . HCT 09/28/2018 38.6* 39.0 - 52.0 % Final  . MCV 09/28/2018 98.5  80.0 - 100.0 fL Final  . MCH 09/28/2018 33.2  26.0 - 34.0 pg Final  . MCHC 09/28/2018 33.7  30.0 - 36.0 g/dL Final  . RDW 09/28/2018 13.6  11.5 - 15.5 % Final  . Platelet Count 09/28/2018 184  150 - 400 K/uL Final  . nRBC 09/28/2018 0.0  0.0 - 0.2 % Final  . Neutrophils Relative % 09/28/2018 59  % Final  . Neutro Abs 09/28/2018 3.0  1.7 - 7.7 K/uL Final  . Lymphocytes Relative 09/28/2018 28  % Final  . Lymphs Abs 09/28/2018 1.4  0.7 - 4.0 K/uL Final  . Monocytes Relative 09/28/2018 8  % Final  . Monocytes Absolute 09/28/2018 0.4  0.1 - 1.0 K/uL Final  . Eosinophils Relative  09/28/2018  4  % Final  . Eosinophils Absolute 09/28/2018 0.2  0.0 - 0.5 K/uL Final  . Basophils Relative 09/28/2018 1  % Final  . Basophils Absolute 09/28/2018 0.1  0.0 - 0.1 K/uL Final  . Immature Granulocytes 09/28/2018 0  % Final  . Abs Immature Granulocytes 09/28/2018 0.00  0.00 - 0.07 K/uL Final   Performed at [facility], [place] [address], [place], [place] [address]     Pathology Orders Placed This Encounter  Procedures  . CBC with Differential ([facility] Only)    Standing Status:   Future    Standing Expiration Date:   09/28/2019  . CMP ([place] only)    Standing Status:   Future    Standing Expiration Date:   09/28/2019  . Lactate dehydrogenase (LDH)    Standing Status:   Future    Standing Expiration Date:   09/28/2019  . Ferritin    Standing Status:   Future    Standing Expiration Date:   09/28/2019  . Iron and TIBC    Standing Status:   Future    Standing Expiration Date:   09/28/2019       Zoila Shutter MD

## 2018-09-29 ENCOUNTER — Telehealth: Payer: Self-pay | Admitting: Internal Medicine

## 2018-09-29 ENCOUNTER — Telehealth: Payer: Self-pay | Admitting: *Deleted

## 2018-09-29 ENCOUNTER — Telehealth: Payer: Self-pay | Admitting: Pharmacist

## 2018-09-29 NOTE — Telephone Encounter (Signed)
Late entry for  09/28/2018 :  Spoke with pt and informed him of iron studies results as per Dr. Walden Field.  Informed pt that MD recommends pt to have phlebotomy next week due to ferritin is still very high.  Pt agreed. However, pt would like to discuss in details with Dr. Walden Field about taking oral medication to help decrease iron storage. Pt did not think he could endure weekly phlebotomy, and stated his veins could not tolerate procedure. Dr. Walden Field notified.  Schedule message sent.

## 2018-09-29 NOTE — Telephone Encounter (Signed)
Oral Oncology Pharmacist Encounter  Exjade 500mg  tablets PA request Key: [employer] authorization is not required for this medication, prescription can be sen to specialty pharmacy for dispensing. It is a tier 5 generic medication (deferasirox)  Jadenu 180mg  tablets PA request Key: [id number] Insurance authorization has been denied.  Ferriprox 500mg  tablets PA request Key: [employer] authorization is not required for this medication, prescription can be sen to specialty pharmacy for dispensing. It is a tier 6 Brand name medication  Johny Drilling, PharmD, BCPS, BCOP  09/29/2018 2:17 PM Oral Oncology Clinic [phone]

## 2018-09-29 NOTE — Telephone Encounter (Signed)
Tried to reach regarding schedule °

## 2018-09-29 NOTE — Telephone Encounter (Signed)
Oral Oncology Pharmacist Encounter  Received call from Dr. Walden Field with questions about insurance coverage for iron chelators for the diagnosis of hereditary hematochromatosis. Patient has been receiving therapeutic phlebotomy and is inquiring about the ability to use an oral medication for iron chelation.  Exjade 500mg  tablets PA request Key: [id number]  Jadenu 180mg  tablets PA request Key: [id number]  Ferriprox 500mg  tablets PA request Key: [id number]  This encounter will continue to be updated until final determination.  Keith Evans, PharmD, BCPS, BCOP  09/29/2018 10:33 AM Oral Oncology Clinic [phone]

## 2018-10-03 ENCOUNTER — Telehealth: Payer: Self-pay | Admitting: *Deleted

## 2018-10-03 NOTE — Telephone Encounter (Signed)
Received call from patient regarding prescription options for his hemachromatosis.Marland Kitchen He states his insurance approved the [facility] and he is asking for prescription to sent for that. Per Johny Drilling [facility], this should be sent to [facility] per his insurance co. Pt is also asking about his follow up appts and when should he come back.  Please contact pt @ [phone]

## 2018-10-04 ENCOUNTER — Ambulatory Visit: Payer: BLUE CROSS/BLUE SHIELD

## 2018-10-04 ENCOUNTER — Other Ambulatory Visit: Payer: Self-pay | Admitting: Internal Medicine

## 2018-10-04 MED ORDER — DEFERASIROX 250 MG PO TBSO: 10.0000 mg/kg | ORAL_TABLET | Freq: Every day | ORAL | 0 refills | Status: DC

## 2018-10-05 ENCOUNTER — Telehealth: Payer: Self-pay

## 2018-10-05 ENCOUNTER — Telehealth: Payer: Self-pay | Admitting: Emergency Medicine

## 2018-10-05 ENCOUNTER — Inpatient Hospital Stay: Payer: BLUE CROSS/BLUE SHIELD

## 2018-10-05 NOTE — Telephone Encounter (Signed)
Called patient and got his voicemail. Left message for patient to return phone call to [phone].

## 2018-10-05 NOTE — Telephone Encounter (Signed)
Called pt to see if he was aware of appt today at 0900.  Pt states that since he was prescribed Exjade by MD Higgs yesterday that he was under the impression that he didn't need to come to his phlebotomy appt today and that it, along with the next phleb appt(s), would be cancelled.  Spoke with MD Higgs who recommended that since Exjade is sent from a specialty pharmacy and the therapeutic effects wouldn't take effect immediately that the pt receive at least one phlebotomy within the next few days.  Per MD Higgs it is voluntary to opt out of the procedure but she recommends it, however he doesn't need any future phlebotomy appts a this time.  Cancelled pt's phleb appt today in [facility] and will cancel future phlebotomy appts.  Desk RN Lanelle Bal for MD Higgs made aware, she will call to see if pt wants to receive a phlebotomy within the next few days or if he wants to just wait for Exjade to take effect.

## 2018-10-05 NOTE — Telephone Encounter (Signed)
Patient returned phone call and stated he does not wish to have another phlebotomy at this time. Patient states he wants to start med and hold off on phlebotomies at this time. Informed patient that Dr. Walden Field recommends a phlebotomy since it will take a while for therapeutic effects to take place from Exjade. Patient voiced understanding and still declines a phlebotomy at this time. Patient instructed to call the office with any further questions or concerns.

## 2018-10-06 ENCOUNTER — Telehealth: Payer: Self-pay | Admitting: Internal Medicine

## 2018-10-06 NOTE — Telephone Encounter (Signed)
Spoke with Keith Evans in pharmacy.  Pt was planned for another phlebotomy on 10/05/2018 and did not show for appointment.  He has notified clinic he does not desire to have addition phlebotomy and has researched medication options for iron chelation.    Denyse Amass has checked with his pharmacy and they will approve Exjade for indication.  Rx sent to specialty pharmacy.  Will await notification from pharmacy that medication has been shipped to patient. Pt planned for repeat labs in 10/2018.

## 2018-10-12 ENCOUNTER — Telehealth: Payer: Self-pay | Admitting: Pharmacist

## 2018-10-12 MED ORDER — DEFERASIROX 250 MG PO TBSO: 10.0000 mg/kg | ORAL_TABLET | Freq: Every day | ORAL | 0 refills | Status: DC

## 2018-10-12 NOTE — Telephone Encounter (Signed)
Oral Oncology Pharmacist Encounter  I had previously attempted insurance authorization for an iron chelating agent for patient for his hemochromatosis. Insurance for generic deferasirox approved. Prescription sent to [facility] specialty pharmacy on 10/04/18. I called [facility] this morning to follow-up on prescription status. They have not yet reached out to patient. Copayment for 1st month's supply >$1000 There are no copayment grant foundations open for iron overload. They would not try to process the prescription as brand name Exjade, in an attempt to get a manufacturer copayment coupon, without a new prescription. I asked [facility] to ensure all claims were reversed.   Will attempt to fill Rx from [facility] for more timely prescription processing.  New prescription has been e-scribed to [facility]. Rx directions have been amended to reflect that patient must make a suspension with Exjade tablets for proper administration.  Oral Oncology Clinic will continue to follow for insurance authorization, copayment issues, and start date.  Johny Drilling, PharmD, BCPS, BCOP  10/12/2018 9:34 AM Oral Oncology Clinic [phone]

## 2018-10-13 ENCOUNTER — Telehealth: Payer: Self-pay | Admitting: *Deleted

## 2018-10-13 ENCOUNTER — Ambulatory Visit (HOSPITAL_BASED_OUTPATIENT_CLINIC_OR_DEPARTMENT_OTHER): Payer: BLUE CROSS/BLUE SHIELD | Admitting: Internal Medicine

## 2018-10-13 NOTE — Telephone Encounter (Signed)
Mr Wyline Mood left a message requesting an email address for Dr Walden Field. He is wanting to send a prescription form. He has found a place that will take his blood.   He would like to schedule a phone call with Dr Walden Field to discuss his situation.

## 2018-10-13 NOTE — Telephone Encounter (Signed)
Oral Oncology Patient Advocate Encounter  I called the patient and before I could go into detail he wanted to explain something to me. He was saying that his blood work was costing over $500 and the Exjade was going to cost almost $2000 and he wanted to talk to his doctor about options and has called and left a message to speak with her.  I explained to him that if he did take Exjade I could help him apply for manufacturer assistance and if approved he would get the medicine shipped to his house for free. I did ask him the income information that [employer] would need and based on that he would be approved.   I gave him my direct number and he will call me after he speaks with the doctor about his options.  The patient verbalized understanding and great appreciation.  [facility] [place] Phone [phone] Fax [phone] 10/13/2018   1:23 PM

## 2018-10-13 NOTE — Telephone Encounter (Signed)
He can fax to office. Please provide best fax number to him.  I will check with Denyse Amass in Pharmacy to discuss status of Exjade which she was working on.

## 2018-10-13 NOTE — Progress Notes (Signed)
Virtual Visit via Telephone Note  I connected with Keith Evans on 10/13/18 at  2:00 PM EDT by telephone and verified that I am speaking with the correct person using two identifiers.   I discussed the limitations, risks, security and privacy concerns of performing an evaluation and management service by telephone and the availability of in person appointments. I also discussed with the patient that there may be a patient responsible charge related to this service. The patient expressed understanding and agreed to proceed.  Interval History:  Historical data obtained from note dated 09/28/2018. 53 year old male referred for evaluation due to elevated LFTs and iron studies.  Pt had labs done 07/27/2018 that showed iron level of 201 and % saturation of 96%.  Hep B and C panels was negative.  Chemistries WNL with Cr 0.88 and K+ 4.2 AST elevated at 88 and ALT elevated at 93.  Occult blood testing negative.  Pt denies any family history of hemochromatosis.  Pt had RUQ USN done 07/10/2018 that showed  IMPRESSION: Normal right upper quadrant ultrasound.  He denies fevers, chills, night sweats, SOB, Headache.  He remains active.  He takes supplements such as B complex and testerone booster.  Pt denies family history of hemochromatosis.  He reports father was told he had NASH.     Observations/Objective: Further discussion about treatment options for hemochromatosis.     Assessment and Plan: 1.  Hemochromatosis, C282Y homozygote.  53 year old male referred for evaluation due to elevated LFTs and iron studies.  Pt had labs done 07/27/2018 that showed iron level of 201 and % saturation of 96%.  Hep B and C panels was negative.  Chemistries WNL with Cr 0.88 and K+ 4.2 AST elevated at 88 and ALT elevated at 93.  Occult blood testing negative.  Pt denies any family history of hemochromatosis.  Pt had RUQ USN done 07/10/2018 that showed  IMPRESSION: Normal right upper quadrant ultrasound.  He denies fevers,  chills, night sweats, SOB, Headache.  He remains active.  He takes supplements such as B complex and testerone booster.    Labs done 08/17/2018 showed WBC 5.3 Hb 13.9 plts 168,000.  He has a normal differential.  Chemistries WNL with K+ 4.3 Cr 0.79.  LFTs are elevated with AST of 78 and ALT of 79.  Ferritin elevated at 5325.  TS is 105%.  Hemochromatosis gene evaluation returned  Result: AFFECTED  Two copies of the same mutation (C282Y and C282Y) identified  Interpretation:  Results for H63D and S65C were negative.   Based on the pt being homozygous for C282Y and with ferritin level of greater than 5000, he is recommended for Phlebotomy to decrease ferritin level 50-100.  He had recent imaging with USN done in 06/2018 that showed no liver abnormalities.  Previously, I discussed diagnosis and potential clinical implications of hemachromatosis.  Pt was also provided written information.    Labs done  09/28/2018 reviewed and showed WBC 5 HB 13 plts 184,000.  Chemistries showed K+ 4.1 Cr 0.88 AST 82 and ALT 88.  Ferritin 4870.  He has undergone 3 phlebotomies.  He reported initially not tolerating procedure.   He was wondering about medications that can be used instead.  I have again discussed with him standard recommendations are for consistent phlebotomy to lower Iron levels and likely once levels have improved frequency of phlebotomy will decrease pending results of iron levels.  Iron chelator medications are usually indicated for pts with iron overload due  to transfusion therapy or hemoglobinopathies.  These medications also have potential side effects.  I have discussed case with pharmacy and have submitted Rx to specialty pharmacy.  Pt had discussion with pharmacy regarding amount of co-pay and has concerns about co-pay costs as well as costs for phlebotomy.  Pt has provided form for [facility] for phlebotomy services. I have completed form for 1 unit phlebotomy ( 500 ML) weekly.  After 8 weeks will repeat  lab studies.   I previously discussed with pt his brother should get iron studies for testing.  Pt reports no known family history of hemochromatosis but does report his father reportedly had NASH.  All questions answered and he expressed understanding of information presented.    2.  Elevated LFTs. Pt is homozygous for hemochromatosis.  USN of abdomen done 06/2018 showed no liver abnormalities.   Pt reports he takes Tylenol sinus frequently. Have advised pt to avoid extensive Tylenol use due to potential effects on liver.  Pt advised to avoid extensive supplement use.  Chemistries done  09/28/2018 showed AST of 82 and ALT of 88.  Will repeat labs in 11/2018 for ongoing follow-up.    3.  Family history of NASH.  Pt reports his father reportedly was diagnosed with this.  He reports he died > 55 yoa..    4.  Sinus congestion.  Pt reports he takes Tylenol sinus frequently.  Have advised pt to avoid extensive Tylenol use due to potential effects on liver.    5.  Health maintenance.  Pt reports he has undergone Colo-guard testing.  Follow-up with PCP as directed.    Follow Up Instructions: Labs 12/06/2018    I discussed the assessment and treatment plan with the patient. The patient was provided an opportunity to ask questions and all were answered. The patient agreed with the plan and demonstrated an understanding of the instructions.   The patient was advised to call back or seek an in-person evaluation if the symptoms worsen or if the condition fails to improve as anticipated.  I provided 15 minutes of non-face-to-face time during this encounter.   Zoila Shutter, MD

## 2018-10-13 NOTE — Telephone Encounter (Signed)
I have spoken with him in detail in the past about recommendations for phlebotomy with hemochromatosis with goal to lower iron levels.  Pt did not want to have additional phlebotomy and wanted to explore if he would be able to get an iron chelator.  I spoke with Denyse Amass to determine if insurance would even pay for medication for that indication and she has discussed with him what you detailed above. Will reiterate information.

## 2018-10-13 NOTE — Telephone Encounter (Signed)
Form received. Dr Walden Field to call patient

## 2018-10-23 NOTE — Telephone Encounter (Signed)
Oral Oncology Patient Advocate Encounter  I followed up with the patient and he will be doing treatments with [facility] for now.  [facility] [place] Phone [phone] Fax [phone] 10/23/2018   3:41 PM

## 2018-10-31 ENCOUNTER — Telehealth: Payer: Self-pay | Admitting: *Deleted

## 2018-10-31 NOTE — Telephone Encounter (Signed)
Herbert Deaner from [facility] called requesting a call back from nurse.   Spoke with Music therapist at [facility], and was informed that due to high copay with generic, if Dr. Walden Field would write a new script for Brand name of Exjade, pharmacy will provide assistance for pt to receive medications. Gave advisor direct fax number to nurses' desk for information to be faxed over for Dr. Walden Field to review. [facility]    Phone     [phone].

## 2018-11-01 ENCOUNTER — Other Ambulatory Visit: Payer: Self-pay | Admitting: *Deleted

## 2018-11-01 ENCOUNTER — Telehealth: Payer: Self-pay | Admitting: Internal Medicine

## 2018-11-01 MED ORDER — DEFERASIROX 250 MG PO TBSO: 10.0000 mg/kg | ORAL_TABLET | Freq: Every day | ORAL | 0 refills | Status: DC

## 2018-11-01 NOTE — Telephone Encounter (Signed)
Pt is now getting phlebotomy at [facility].  Denyse Amass Do you think [facility] info above with have any significant impact on copay?

## 2018-11-01 NOTE — Telephone Encounter (Signed)
Scheduled appt per 5/22 los - pt is aware of appt date and time

## 2018-11-02 ENCOUNTER — Other Ambulatory Visit: Payer: BLUE CROSS/BLUE SHIELD

## 2018-11-02 ENCOUNTER — Ambulatory Visit: Payer: BLUE CROSS/BLUE SHIELD | Admitting: Internal Medicine

## 2018-11-03 ENCOUNTER — Telehealth: Payer: Self-pay | Admitting: Emergency Medicine

## 2018-11-03 NOTE — Telephone Encounter (Signed)
Received VM from Friend at [facility] asking for further clarification of pt's Exjade order.  Spoke with pharmacist along with MD Higgs over phone, MD Higgs gave verbal order to cancel medication at this time since pt is now receiving phlebotomies instead and has not clarified that he would like to continue with Exjade d/t costs.  Pharmacist verbalized understanding and will cancel the prescription for now.

## 2018-11-28 ENCOUNTER — Telehealth: Payer: Self-pay | Admitting: *Deleted

## 2018-11-28 NOTE — Telephone Encounter (Signed)
Received vm message from patient requesting his upcoming labs be done @ his PCP's office (Dr. London Pepper @ [place])  [facility] is out of network for him and this would cost him too much. Lab requisitions for 12/06/18 were fax'd to [facility] @ [place]  [phone]. Pt notified.  Lab for 12/06/18 here @ [facility] cancelled

## 2018-12-06 ENCOUNTER — Other Ambulatory Visit: Payer: BLUE CROSS/BLUE SHIELD

## 2018-12-06 DIAGNOSIS — R5383 Other fatigue: Secondary | ICD-10-CM | POA: Diagnosis not present

## 2019-02-16 DIAGNOSIS — R7989 Other specified abnormal findings of blood chemistry: Secondary | ICD-10-CM | POA: Insufficient documentation

## 2019-02-16 DIAGNOSIS — Z87891 Personal history of nicotine dependence: Secondary | ICD-10-CM | POA: Diagnosis not present

## 2019-05-10 ENCOUNTER — Telehealth: Payer: Self-pay | Admitting: Hematology and Oncology

## 2019-05-10 NOTE — Telephone Encounter (Signed)
Higgs transfer to [place]. Spoke with patient and patient declined appointment at this time. Patient having labs monitored and will contact office if/when appointment with hematologist needed.

## 2019-07-30 IMAGING — US US ABDOMEN LIMITED
1 series · 14 of 25 positions shown · non-contrast
Comparison: None.

CLINICAL DATA: Elevated liver function test.

EXAM:
ULTRASOUND ABDOMEN LIMITED RIGHT UPPER QUADRANT

[Series 1: us abdomen limited · 0.23mm/px · 14 of 45 slices shown]
[im 1/45]
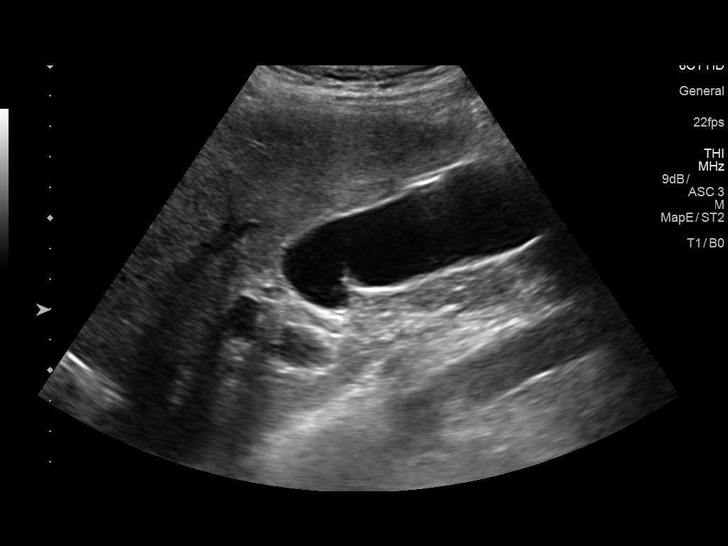
[im 4/45]
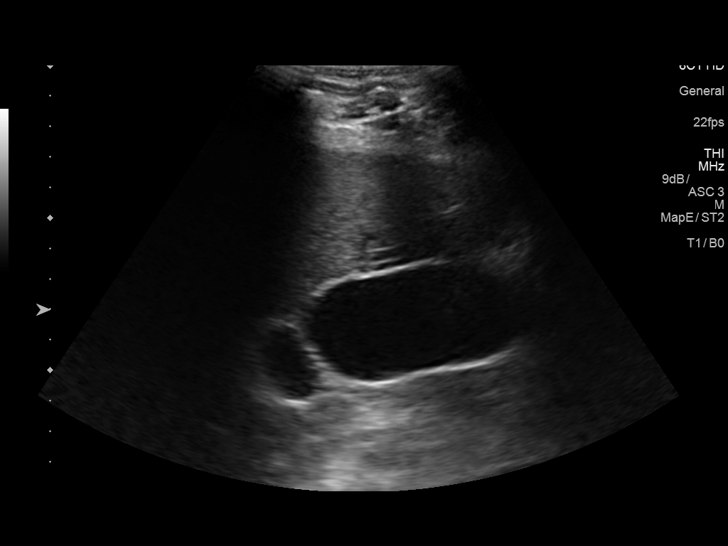
[im 8/45]
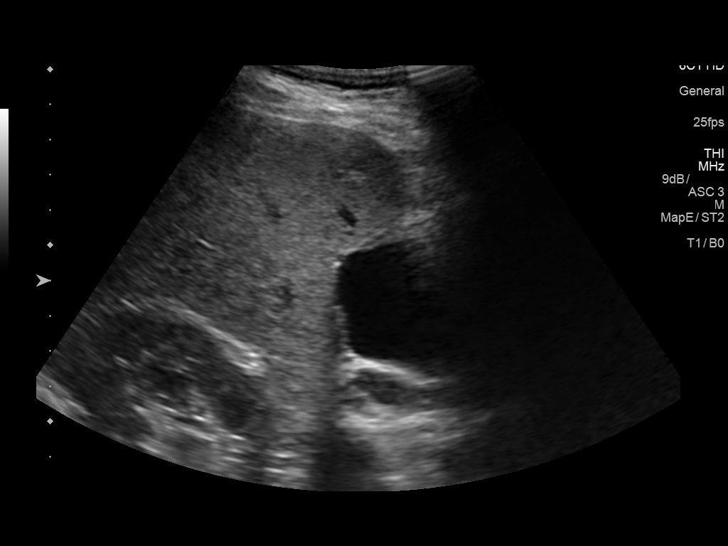
[im 12/45]
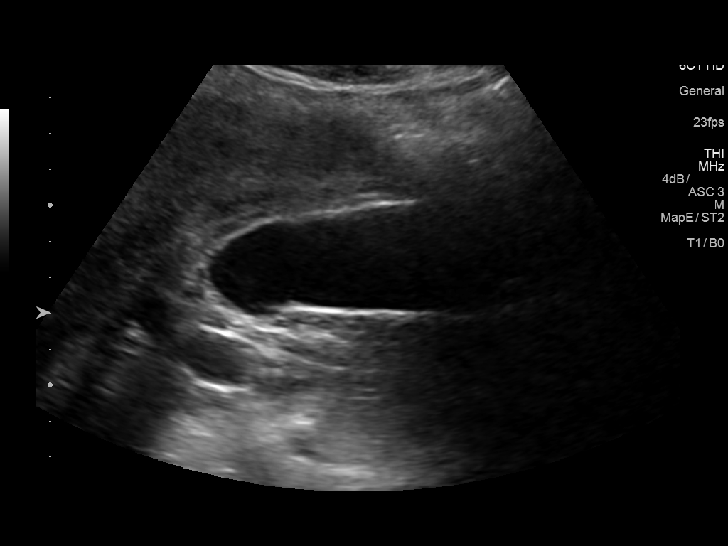
[im 15/45]
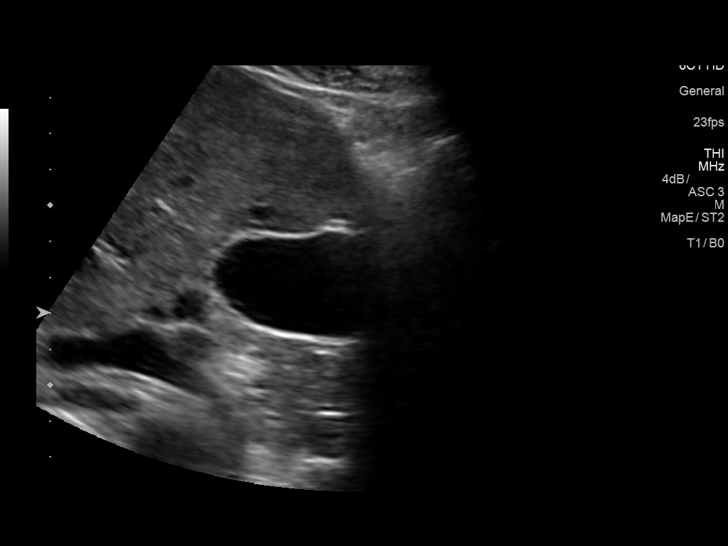
[im 17/45]
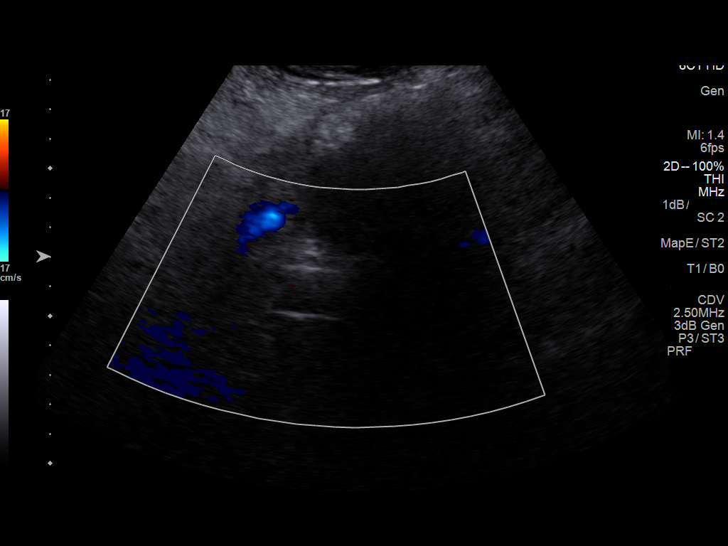
[im 21/45]
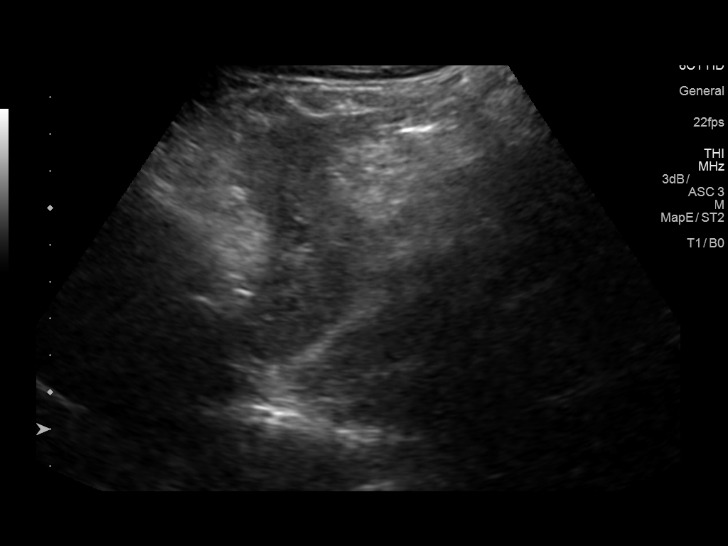
[im 24/45]
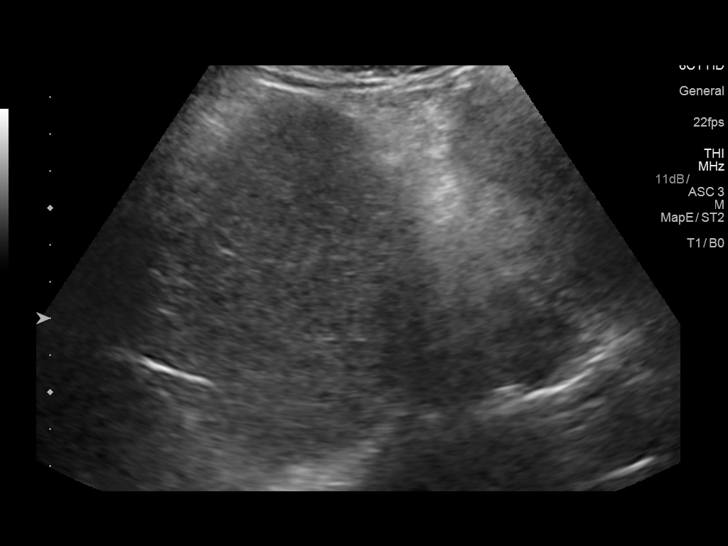
[im 28/45]
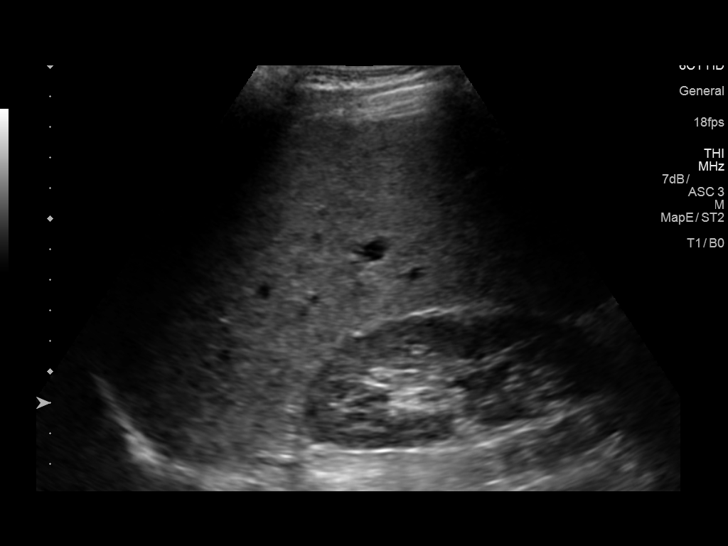
[im 30/45]
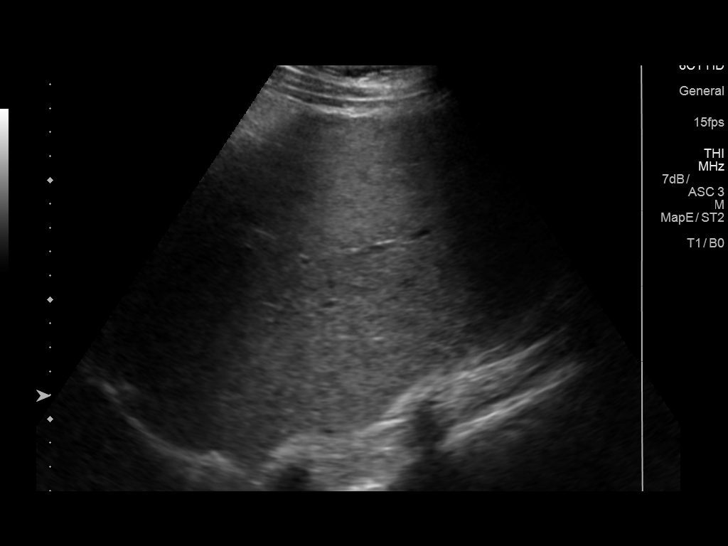
[im 34/45]
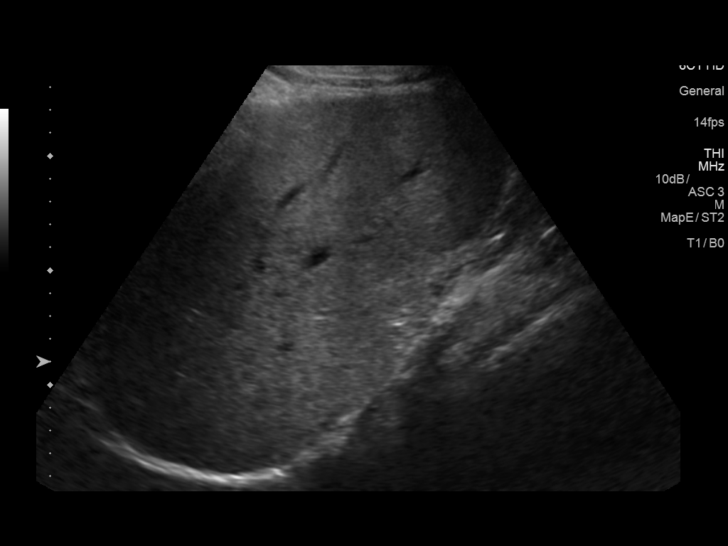
[im 37/45]
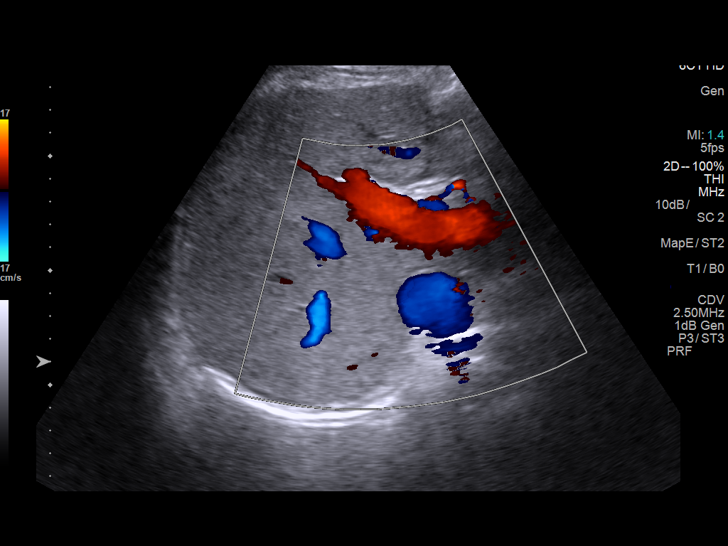
[im 41/45]
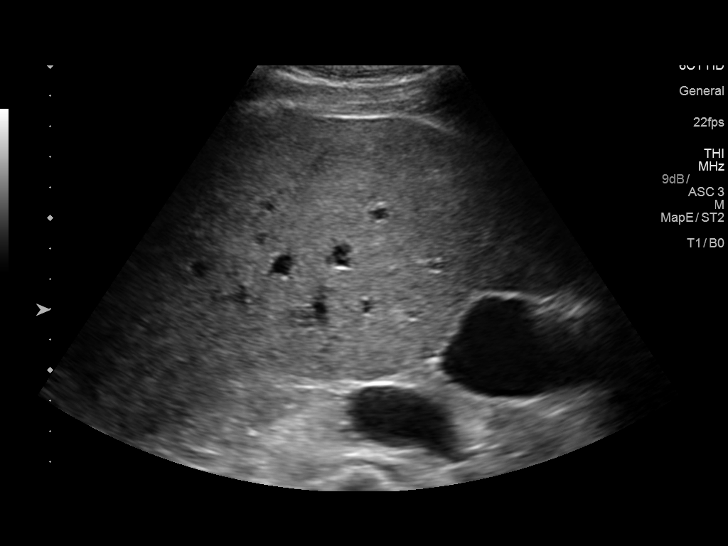
[im 45/45]
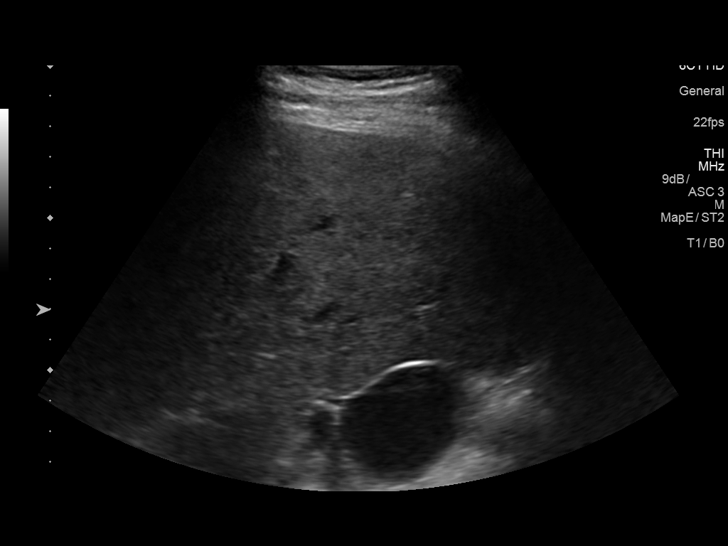

[14 of 25 positions shown; findings below may reference images not displayed]

FINDINGS: Gallbladder:

No gallstones or wall thickening visualized. No sonographic Murphy
sign noted by sonographer.

Common bile duct:

Diameter: 4 mm

Liver:

No focal lesion identified. Within normal limits in parenchymal
echogenicity. Portal vein is patent on color Doppler imaging with
normal direction of blood flow towards the liver.
IMPRESSION: Normal right upper quadrant ultrasound.

## 2019-08-14 ENCOUNTER — Other Ambulatory Visit: Payer: Self-pay | Admitting: Family Medicine

## 2019-08-15 ENCOUNTER — Other Ambulatory Visit: Payer: Self-pay | Admitting: Family Medicine

## 2019-08-15 DIAGNOSIS — R7989 Other specified abnormal findings of blood chemistry: Secondary | ICD-10-CM

## 2019-08-22 ENCOUNTER — Ambulatory Visit
Admission: RE | Admit: 2019-08-22 | Discharge: 2019-08-22 | Disposition: A | Discharge status: Home / Self Care | Payer: BLUE CROSS/BLUE SHIELD | Source: Ambulatory Visit | Attending: Family Medicine | Admitting: Family Medicine

## 2019-08-22 ENCOUNTER — Other Ambulatory Visit: Payer: Self-pay

## 2019-08-22 DIAGNOSIS — R7989 Other specified abnormal findings of blood chemistry: Secondary | ICD-10-CM

## 2019-08-22 MED ORDER — GADOBENATE DIMEGLUMINE 529 MG/ML IV SOLN
16.0000 mL | Freq: Once | INTRAVENOUS | Status: AC | PRN
  Administered 2019-08-22: 16 mL via INTRAVENOUS

## 2019-08-28 ENCOUNTER — Other Ambulatory Visit: Payer: Self-pay | Admitting: Family Medicine

## 2019-08-28 DIAGNOSIS — R160 Hepatomegaly, not elsewhere classified: Secondary | ICD-10-CM

## 2019-09-06 ENCOUNTER — Other Ambulatory Visit: Payer: Self-pay | Admitting: Family Medicine

## 2019-09-06 DIAGNOSIS — R160 Hepatomegaly, not elsewhere classified: Secondary | ICD-10-CM

## 2019-09-13 ENCOUNTER — Other Ambulatory Visit: Payer: Self-pay

## 2019-09-13 ENCOUNTER — Ambulatory Visit
Admission: RE | Admit: 2019-09-13 | Discharge: 2019-09-13 | Disposition: A | Discharge status: Home / Self Care | Payer: 59 | Source: Ambulatory Visit | Attending: Family Medicine | Admitting: Family Medicine

## 2019-09-13 DIAGNOSIS — R160 Hepatomegaly, not elsewhere classified: Secondary | ICD-10-CM

## 2019-09-13 MED ORDER — IOPAMIDOL (ISOVUE-300) INJECTION 61%
100.0000 mL | Freq: Once | INTRAVENOUS | Status: AC | PRN
  Administered 2019-09-13: 100 mL via INTRAVENOUS

## 2019-12-18 ENCOUNTER — Other Ambulatory Visit: Payer: Self-pay | Admitting: Nurse Practitioner

## 2019-12-18 DIAGNOSIS — D376 Neoplasm of uncertain behavior of liver, gallbladder and bile ducts: Secondary | ICD-10-CM

## 2019-12-27 ENCOUNTER — Ambulatory Visit
Admission: RE | Admit: 2019-12-27 | Discharge: 2019-12-27 | Disposition: A | Discharge status: Home / Self Care | Payer: 59 | Source: Ambulatory Visit | Attending: Nurse Practitioner | Admitting: Nurse Practitioner

## 2019-12-27 DIAGNOSIS — D376 Neoplasm of uncertain behavior of liver, gallbladder and bile ducts: Secondary | ICD-10-CM

## 2020-06-06 ENCOUNTER — Other Ambulatory Visit: Payer: 59

## 2020-06-06 DIAGNOSIS — Z20822 Contact with and (suspected) exposure to covid-19: Secondary | ICD-10-CM

## 2020-06-10 LAB — NOVEL CORONAVIRUS, NAA: SARS-CoV-2, NAA: NOT DETECTED

## 2020-09-09 DIAGNOSIS — K769 Liver disease, unspecified: Secondary | ICD-10-CM | POA: Insufficient documentation

## 2020-09-15 ENCOUNTER — Other Ambulatory Visit: Payer: Self-pay | Admitting: Nurse Practitioner

## 2020-09-15 DIAGNOSIS — R7989 Other specified abnormal findings of blood chemistry: Secondary | ICD-10-CM | POA: Insufficient documentation

## 2020-09-15 DIAGNOSIS — K769 Liver disease, unspecified: Secondary | ICD-10-CM

## 2020-11-10 ENCOUNTER — Encounter: Payer: Self-pay | Admitting: Internal Medicine

## 2020-11-16 ENCOUNTER — Encounter: Payer: Self-pay | Admitting: Internal Medicine

## 2020-11-17 ENCOUNTER — Ambulatory Visit
Admission: RE | Admit: 2020-11-17 | Discharge: 2020-11-17 | Disposition: A | Discharge status: Home / Self Care | Payer: 59 | Source: Ambulatory Visit | Attending: Nurse Practitioner | Admitting: Nurse Practitioner

## 2020-11-17 ENCOUNTER — Other Ambulatory Visit: Payer: Self-pay

## 2020-11-17 DIAGNOSIS — K769 Liver disease, unspecified: Secondary | ICD-10-CM

## 2020-11-17 MED ORDER — GADOBENATE DIMEGLUMINE 529 MG/ML IV SOLN
15.0000 mL | Freq: Once | INTRAVENOUS | Status: AC | PRN
  Administered 2020-11-17: 15 mL via INTRAVENOUS

## 2021-01-05 ENCOUNTER — Telehealth: Payer: Self-pay | Admitting: *Deleted

## 2021-01-05 NOTE — Telephone Encounter (Signed)
Per referral Dr. Orland Mustard - called and lvm of upcoming appointments - requested call back to confirm - mailed welcome packet with calendar

## 2021-02-03 ENCOUNTER — Other Ambulatory Visit: Payer: 59

## 2021-02-09 ENCOUNTER — Inpatient Hospital Stay (HOSPITAL_BASED_OUTPATIENT_CLINIC_OR_DEPARTMENT_OTHER): Payer: 59 | Admitting: Hematology & Oncology

## 2021-02-09 ENCOUNTER — Other Ambulatory Visit: Payer: Self-pay

## 2021-02-09 ENCOUNTER — Encounter: Payer: Self-pay | Admitting: Hematology & Oncology

## 2021-02-09 ENCOUNTER — Inpatient Hospital Stay: Payer: 59 | Attending: Hematology & Oncology

## 2021-02-09 ENCOUNTER — Other Ambulatory Visit: Payer: Self-pay | Admitting: *Deleted

## 2021-02-09 DIAGNOSIS — R5383 Other fatigue: Secondary | ICD-10-CM | POA: Insufficient documentation

## 2021-02-09 DIAGNOSIS — Z87891 Personal history of nicotine dependence: Secondary | ICD-10-CM | POA: Diagnosis not present

## 2021-02-09 DIAGNOSIS — Z8639 Personal history of other endocrine, nutritional and metabolic disease: Secondary | ICD-10-CM

## 2021-02-09 LAB — CBC WITH DIFFERENTIAL (CANCER CENTER ONLY)
Abs Immature Granulocytes: 0.03 10*3/uL (ref 0.00–0.07)
Basophils Absolute: 0.1 10*3/uL (ref 0.0–0.1)
Basophils Relative: 1 %
Eosinophils Absolute: 0.1 10*3/uL (ref 0.0–0.5)
Eosinophils Relative: 2 %
HCT: 41.9 % (ref 39.0–52.0)
Hemoglobin: 14.8 g/dL (ref 13.0–17.0)
Immature Granulocytes: 0 %
Lymphocytes Relative: 24 %
Lymphs Abs: 1.8 10*3/uL (ref 0.7–4.0)
MCH: 33.3 pg (ref 26.0–34.0)
MCHC: 35.3 g/dL (ref 30.0–36.0)
MCV: 94.4 fL (ref 80.0–100.0)
Monocytes Absolute: 0.4 10*3/uL (ref 0.1–1.0)
Monocytes Relative: 5 %
Neutro Abs: 5 10*3/uL (ref 1.7–7.7)
Neutrophils Relative %: 68 %
Platelet Count: 190 10*3/uL (ref 150–400)
RBC: 4.44 MIL/uL (ref 4.22–5.81)
RDW: 12.3 % (ref 11.5–15.5)
WBC Count: 7.3 10*3/uL (ref 4.0–10.5)
nRBC: 0 % (ref 0.0–0.2)

## 2021-02-09 LAB — CMP (CANCER CENTER ONLY)
ALT: 16 U/L (ref 0–44)
AST: 19 U/L (ref 15–41)
Albumin: 5 g/dL (ref 3.5–5.0)
Alkaline Phosphatase: 54 U/L (ref 38–126)
Anion gap: 6 (ref 5–15)
BUN: 19 mg/dL (ref 6–20)
CO2: 31 mmol/L (ref 22–32)
Calcium: 9.9 mg/dL (ref 8.9–10.3)
Chloride: 102 mmol/L (ref 98–111)
Creatinine: 1 mg/dL (ref 0.61–1.24)
GFR, Estimated: >60 mL/min (ref >60)
Glucose, Bld: 102 mg/dL — ABNORMAL HIGH (ref 70–99)
Potassium: 4.3 mmol/L (ref 3.5–5.1)
Sodium: 139 mmol/L (ref 135–145)
Total Bilirubin: 1.1 mg/dL (ref 0.3–1.2)
Total Protein: 7 g/dL (ref 6.5–8.1)

## 2021-02-09 LAB — FERRITIN: Ferritin: 56 ng/mL (ref 24–336)

## 2021-02-09 LAB — IRON AND TIBC
Iron: 254 ug/dL — ABNORMAL HIGH (ref 42–163)
Saturation Ratios: 101 % — ABNORMAL HIGH (ref 20–55)
TIBC: 251 ug/dL (ref 202–409)
UIBC: UNDETERMINED ug/dL (ref 117–376)

## 2021-02-10 ENCOUNTER — Encounter: Payer: Self-pay | Admitting: Hematology & Oncology

## 2021-02-10 LAB — TESTOSTERONE: Testosterone: 204 ng/dL — ABNORMAL LOW (ref 264–916)

## 2021-02-11 ENCOUNTER — Telehealth: Payer: Self-pay

## 2021-02-11 NOTE — Telephone Encounter (Signed)
No 02/09/21 los or note avail at this time.  Message to Dr Marin Olp and Suanne Marker.  Webb Silversmith

## 2021-02-12 ENCOUNTER — Ambulatory Visit: Payer: 59 | Admitting: Hematology & Oncology

## 2021-02-12 ENCOUNTER — Other Ambulatory Visit: Payer: 59

## 2021-02-13 ENCOUNTER — Encounter: Payer: Self-pay | Admitting: Internal Medicine

## 2021-02-13 ENCOUNTER — Telehealth: Payer: Self-pay | Admitting: *Deleted

## 2021-02-13 NOTE — Progress Notes (Signed)
Referral MD  Reason for Referral: Hereditary hemochromatosis-homozygous for C282Y mutation  Chief Complaint  Patient presents with   New Patient (Initial Visit)    Hemochromotosis  : Maintain ferritin below 50  HPI: Mr. Keith Evans is a really nice 55 year old white male.  He runs a Associate Professor.   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  He says that his business did so well during the pandemic.  Back in 2020, he was diagnosed with hemochromatosis.  Apparently, his ferritin was almost 5000.  An iron saturation of 96%.  He has been phlebotomized.  He was treated in the [facility] system.  He has had I think over 70 phlebotomies.  He is also been seen at [facility] by the liver specialist.  He had a FibroScan of his liver which did not show any fibrosis.  I think back in April, his ferritin was 46 with an iron saturation of 85%.  He has iron avidity.  He does not want to have phlebotomies.  He says that his ferritin really has been a good indication for him.  His real problem has been low testosterone.  We checked a testosterone level on him today.  It was 200.  He has been on testosterone gel.  This does not appear to be working for him.  We will have to have his endocrinologist see about doing injectable testosterone.  He does feel tired.  He does not have the energy that he used to have.  He feels this is from the low testosterone.  It is certainly possible that the hemochromatosis has been a source of his Hypotestosteronemia.  He does not smoke.  He really does not have alcoholic beverages.  He is not aware of anybody in the family who has hemochromatosis.  He exercises quite a bit.  Again he does not feel as energetic because of the low testosterone.  Overall, I would say his performance status is probably ECOG 1.  Past Medical History:  Diagnosis Date   Other hemochromatosis 09/07/2018   Sinus congestion    :  History reviewed. No pertinent surgical history.:   Current Outpatient Medications:    b complex vitamins tablet, Take 1 tablet by mouth daily., Disp: , Rfl:    fluticasone (FLONASE) 50 MCG/ACT nasal spray, Place 2 sprays into both nostrils daily as needed., Disp: , Rfl:    guaiFENesin (MUCINEX) 600 MG 12 hr tablet, Take 600 mg by mouth as needed. , Disp: , Rfl:    Multiple Vitamin (MULTIVITAMIN) tablet, Take 1 tablet by mouth daily. 02/09/2021 Takes vitamin without iron and C., Disp: , Rfl:    Testosterone 20.25 MG/ACT (1.62%) GEL, SMARTSIG:2 Pump Topical Daily, Disp: , Rfl: :  :  Not on File:   Family History  Problem Relation Age of Onset   Hemochromatosis Neg Hx   :   Social History   Socioeconomic History   Marital status: Single    Spouse name: Not on file   Number of children: Not on file   Years of education: Not on file   Highest education level: Not on file  Occupational History   Not on file  Tobacco Use   Smoking status: Former    Packs/day: 2.00    Years: 10.00    Pack years: 20.00    Types: Cigarettes    Quit date: 05/25/1995    Years since quitting: 25.7  Smokeless tobacco: Never  Substance and Sexual Activity   Alcohol use: Not Currently   Drug use: Not Currently   Sexual activity: Not Currently  Other Topics Concern   Not on file  Social History Narrative   Not on file   Social Determinants of Health   Financial Resource Strain: Not on file  Food Insecurity: Not on file  Transportation Needs: Not on file  Physical Activity: Not on file  Stress: Not on file  Social Connections: Not on file  Intimate Partner Violence: Not on file  :  Review of Systems  Constitutional:  Positive for malaise/fatigue.  HENT: Negative.    Eyes: Negative.   Respiratory: Negative.    Cardiovascular: Negative.   Gastrointestinal: Negative.   Genitourinary: Negative.   Musculoskeletal: Negative.   Skin: Negative.   Neurological: Negative.    Endo/Heme/Allergies: Negative.   Psychiatric/Behavioral: Negative.      Exam: @IPVITALS @ This is a well-developed and well-nourished white male in no obvious distress.  Vital signs show temperature of 98.2.  Pulse 66.  Blood pressure 105/67.  Weight is 169 pounds.  Head and neck exam shows no ocular or oral lesions.  There is no scleral icterus.  He has no adenopathy in the neck.  Lungs are clear bilaterally.  Thyroid is nonpalpable.  Cardiac exam regular rate and rhythm with no murmurs, rubs or bruits.  Abdomen is soft.  Good bowel sounds.  There is no fluid wave.  There is no palpable liver or spleen tip.  Back exam shows no tenderness over the spine, ribs or hips.  Extremities shows no clubbing, cyanosis or edema.  Skin exam shows no rashes, ecchymoses or petechia.  Neurological exam shows no focal neurological deficits.  No results for input(s): WBC, HGB, HCT, PLT in the last 72 hours. No results for input(s): NA, K, CL, CO2, GLUCOSE, BUN, CREATININE, CALCIUM in the last 72 hours.  Blood smear review: None  Pathology: None    Assessment and Plan: Mr.Keith Evans is a really nice 55 year old white male.  He has hemochromatosis.  I think this is been under very good control.  I think the iron saturation is probably more reflective of iron avidity being phlebotomized all the time.  Today, his ferritin is 56 with an iron saturation of 101%.  Again, I have to believe that this is iron avidity.  I spoke to him.  I think that we can just watch him for right now.  He feels quite confident of watching and not phlebotomizing.  His big concern is try to get testosterone.  I told him that this is some that his endocrinologist will have to manage.  If not his endocrinologist, then a urologist could certainly help out.  I think that we can probably get her back in 6 months.  Mr. Keith Evans is clearly okay with this.  He is quite busy with work.  He is married to his husband.  They certainly have a wonderful  life together.

## 2021-02-13 NOTE — Telephone Encounter (Signed)
Per 02/09/21 los gave upcoming appointments - confirmed

## 2021-08-10 ENCOUNTER — Encounter: Payer: Self-pay | Admitting: *Deleted

## 2021-08-10 ENCOUNTER — Encounter: Payer: Self-pay | Admitting: Hematology & Oncology

## 2021-08-10 ENCOUNTER — Inpatient Hospital Stay (HOSPITAL_BASED_OUTPATIENT_CLINIC_OR_DEPARTMENT_OTHER): Payer: 59 | Admitting: Hematology & Oncology

## 2021-08-10 ENCOUNTER — Other Ambulatory Visit: Payer: Self-pay

## 2021-08-10 ENCOUNTER — Inpatient Hospital Stay: Payer: 59 | Attending: Hematology & Oncology

## 2021-08-10 DIAGNOSIS — E349 Endocrine disorder, unspecified: Secondary | ICD-10-CM | POA: Insufficient documentation

## 2021-08-10 DIAGNOSIS — Z87891 Personal history of nicotine dependence: Secondary | ICD-10-CM | POA: Diagnosis not present

## 2021-08-10 DIAGNOSIS — Z79890 Hormone replacement therapy (postmenopausal): Secondary | ICD-10-CM | POA: Insufficient documentation

## 2021-08-10 LAB — CMP (CANCER CENTER ONLY)
ALT: 12 U/L (ref 0–44)
AST: 17 U/L (ref 15–41)
Albumin: 4.8 g/dL (ref 3.5–5.0)
Alkaline Phosphatase: 58 U/L (ref 38–126)
Anion gap: 7 (ref 5–15)
BUN: 22 mg/dL — ABNORMAL HIGH (ref 6–20)
CO2: 31 mmol/L (ref 22–32)
Calcium: 10.4 mg/dL — ABNORMAL HIGH (ref 8.9–10.3)
Chloride: 103 mmol/L (ref 98–111)
Creatinine: 1.03 mg/dL (ref 0.61–1.24)
GFR, Estimated: >60 mL/min (ref >60)
Glucose, Bld: 96 mg/dL (ref 70–99)
Potassium: 5.1 mmol/L (ref 3.5–5.1)
Sodium: 141 mmol/L (ref 135–145)
Total Bilirubin: 1.4 mg/dL — ABNORMAL HIGH (ref 0.3–1.2)
Total Protein: 6.9 g/dL (ref 6.5–8.1)

## 2021-08-10 LAB — CBC WITH DIFFERENTIAL (CANCER CENTER ONLY)
Abs Immature Granulocytes: 0.01 10*3/uL (ref 0.00–0.07)
Basophils Absolute: 0.1 10*3/uL (ref 0.0–0.1)
Basophils Relative: 1 %
Eosinophils Absolute: 0.2 10*3/uL (ref 0.0–0.5)
Eosinophils Relative: 3 %
HCT: 44.7 % (ref 39.0–52.0)
Hemoglobin: 15.5 g/dL (ref 13.0–17.0)
Immature Granulocytes: 0 %
Lymphocytes Relative: 26 %
Lymphs Abs: 1.3 10*3/uL (ref 0.7–4.0)
MCH: 33 pg (ref 26.0–34.0)
MCHC: 34.7 g/dL (ref 30.0–36.0)
MCV: 95.3 fL (ref 80.0–100.0)
Monocytes Absolute: 0.4 10*3/uL (ref 0.1–1.0)
Monocytes Relative: 8 %
Neutro Abs: 3.2 10*3/uL (ref 1.7–7.7)
Neutrophils Relative %: 62 %
Platelet Count: 186 10*3/uL (ref 150–400)
RBC: 4.69 MIL/uL (ref 4.22–5.81)
RDW: 12.9 % (ref 11.5–15.5)
WBC Count: 5.1 10*3/uL (ref 4.0–10.5)
nRBC: 0 % (ref 0.0–0.2)

## 2021-08-10 LAB — IRON AND IRON BINDING CAPACITY (CC-WL,HP ONLY)
Iron: 290 ug/dL — ABNORMAL HIGH (ref 45–182)
Saturation Ratios: 98 % — ABNORMAL HIGH (ref 17.9–39.5)
TIBC: 295 ug/dL (ref 250–450)
UIBC: 5 ug/dL — ABNORMAL LOW (ref 117–376)

## 2021-08-10 LAB — FERRITIN: Ferritin: 47 ng/mL (ref 24–336)

## 2021-08-10 NOTE — Progress Notes (Signed)
?Hematology and Oncology Follow Up Visit ? ?Keith Evans ?[id number] ?1965/07/01 56 y.o. ?08/10/2021 ? ? ?Principle Diagnosis:  ?Hemochromatosis --homozygous for C282Y ? ?Current Therapy:   ?Phlebotomy to maintain ferritin less than 100 ?    ?Interim History:  Keith Evans is back for follow-up.  This is his second office visit.  First saw him back in September.  At that time, his ferritin was 56 with an iron saturation of 101%.  The iron saturation was more of iron avidity. ? ?We are just  watching him.  He had no problems over the holiday season.  He did get COVID on January 1.  He was not hospitalized. ? ?He has been traveling.  He has been out to [facility].  He is going up to [place] I think soon. ? ?He does have some ankle issues.  He is trying to get back into running but the ankles are little bit sore. ? ?He has had no bleeding.  There is no change in bowel or bladder habits. ? ?Doing a whole lot better on injectable testosterone.  He is doing this weekly.  He says he feels a whole lot better. ? ?Overall, I would say his performance status is probably ECOG 0. ? ?Medications:  ?Current Outpatient Medications:  ?  b complex vitamins tablet, Take 1 tablet by mouth daily., Disp: , Rfl:  ?  fluticasone (FLONASE) 50 MCG/ACT nasal spray, Place 2 sprays into both nostrils daily as needed., Disp: , Rfl:  ?  guaiFENesin (MUCINEX) 600 MG 12 hr tablet, Take 600 mg by mouth as needed. , Disp: , Rfl:  ?  Multiple Vitamin (MULTIVITAMIN) tablet, Take 1 tablet by mouth daily. 02/09/2021 Takes vitamin without iron and C., Disp: , Rfl:  ?  Testosterone 20.25 MG/ACT (1.62%) GEL, SMARTSIG:2 Pump Topical Daily, Disp: , Rfl:  ?  XYOSTED 75 MG/0.5ML SOAJ, Inject 75 mg into the skin once a week., Disp: , Rfl:  ? ?Allergies:  ?Allergies  ?Allergen Reactions  ? Propofol Other (See Comments)  ? ? ?Past Medical History, Surgical history, Social history, and Family History were reviewed and updated. ? ?Review of Systems: ?Review of  Systems  ?Constitutional: Negative.   ?HENT:  Negative.    ?Eyes: Negative.   ?Respiratory: Negative.    ?Cardiovascular: Negative.   ?Gastrointestinal: Negative.   ?Endocrine: Negative.   ?Genitourinary: Negative.    ?Musculoskeletal:  Positive for arthralgias.  ?Skin: Negative.   ?Neurological: Negative.   ?Hematological: Negative.   ?Psychiatric/Behavioral: Negative.    ? ?Physical Exam: ? height is 5' 10.5" (1.791 m) and weight is 173 lb (78.5 kg). His oral temperature is 97.9 ?F (36.6 ?C). His blood pressure is 133/62 and his pulse is 55 (abnormal). His respiration is 17 and oxygen saturation is 98%.  ? ?Wt Readings from Last 3 Encounters:  ?08/10/21 173 lb (78.5 kg)  ?02/09/21 169 lb 1.3 oz (76.7 kg)  ?09/28/18 168 lb (76.2 kg)  ? ? ?Physical Exam ?Vitals reviewed.  ?HENT:  ?   Head: Normocephalic and atraumatic.  ?Eyes:  ?   Pupils: Pupils are equal, round, and reactive to light.  ?Cardiovascular:  ?   Rate and Rhythm: Normal rate and regular rhythm.  ?   Heart sounds: Normal heart sounds.  ?Pulmonary:  ?   Effort: Pulmonary effort is normal.  ?   Breath sounds: Normal breath sounds.  ?Abdominal:  ?   General: Bowel sounds are normal.  ?   Palpations: Abdomen is  soft.  ?Musculoskeletal:     ?   General: No tenderness or deformity. Normal range of motion.  ?   Cervical back: Normal range of motion.  ?Lymphadenopathy:  ?   Cervical: No cervical adenopathy.  ?Skin: ?   General: Skin is warm and dry.  ?   Findings: No erythema or rash.  ?Neurological:  ?   Mental Status: He is alert and oriented to person, place, and time.  ?Psychiatric:     ?   Behavior: Behavior normal.     ?   Thought Content: Thought content normal.     ?   Judgment: Judgment normal.  ? ? ? ?Lab Results  ?Component Value Date  ? WBC 5.1 08/10/2021  ? HGB 15.5 08/10/2021  ? HCT 44.7 08/10/2021  ? MCV 95.3 08/10/2021  ? PLT 186 08/10/2021  ? ?  Chemistry   ?   ?Component Value Date/Time  ? NA 141 08/10/2021 0802  ? K 5.1 08/10/2021 0802  ? CL  103 08/10/2021 0802  ? CO2 31 08/10/2021 0802  ? BUN 22 (H) 08/10/2021 0802  ? CREATININE 1.03 08/10/2021 0802  ?    ?Component Value Date/Time  ? CALCIUM 10.4 (H) 08/10/2021 0802  ? ALKPHOS 58 08/10/2021 0802  ? AST 17 08/10/2021 0802  ? ALT 12 08/10/2021 0802  ? BILITOT 1.4 (H) 08/10/2021 0802  ?  ? ? ?Impression and Plan: ?Keith Evans is a very nice 56 year old white male.  He has hemochromatosis.  He is homozygous for the major mutation. ? ?We will see what his iron studies look like.  We really go by his ferritin. ? ?His activity level is still quite good.  I am glad that he is able to do what he would like to do.  Most glad that the testosterone seems to be helping him quite a bit. ? ?We will plan to get him back in another 6 months.  We will get him back sooner depending on his iron studies. ? ? ?Volanda Napoleon, MD ?3/20/20238:42 AM  ?

## 2021-08-11 ENCOUNTER — Encounter: Payer: Self-pay | Admitting: *Deleted

## 2021-08-11 LAB — TESTOSTERONE: Testosterone: 570 ng/dL (ref 264–916)

## 2021-10-06 DIAGNOSIS — H8110 Benign paroxysmal vertigo, unspecified ear: Secondary | ICD-10-CM | POA: Insufficient documentation

## 2022-02-08 ENCOUNTER — Encounter: Payer: Self-pay | Admitting: Hematology & Oncology

## 2022-02-08 ENCOUNTER — Inpatient Hospital Stay (HOSPITAL_BASED_OUTPATIENT_CLINIC_OR_DEPARTMENT_OTHER): Payer: 59 | Admitting: Hematology & Oncology

## 2022-02-08 ENCOUNTER — Encounter: Payer: Self-pay | Admitting: *Deleted

## 2022-02-08 ENCOUNTER — Inpatient Hospital Stay: Payer: 59 | Attending: Hematology & Oncology

## 2022-02-08 ENCOUNTER — Other Ambulatory Visit: Payer: Self-pay

## 2022-02-08 VITALS — BP 122/67 | HR 52 | Temp 97.6°F | Resp 16 | Wt 164.0 lb

## 2022-02-08 DIAGNOSIS — R7989 Other specified abnormal findings of blood chemistry: Secondary | ICD-10-CM

## 2022-02-08 LAB — CBC WITH DIFFERENTIAL (CANCER CENTER ONLY)
Abs Immature Granulocytes: 0.01 10*3/uL (ref 0.00–0.07)
Basophils Absolute: 0.1 10*3/uL (ref 0.0–0.1)
Basophils Relative: 1 %
Eosinophils Absolute: 0.2 10*3/uL (ref 0.0–0.5)
Eosinophils Relative: 4 %
HCT: 43.6 % (ref 39.0–52.0)
Hemoglobin: 15.1 g/dL (ref 13.0–17.0)
Immature Granulocytes: 0 %
Lymphocytes Relative: 27 %
Lymphs Abs: 1.2 10*3/uL (ref 0.7–4.0)
MCH: 33.7 pg (ref 26.0–34.0)
MCHC: 34.6 g/dL (ref 30.0–36.0)
MCV: 97.3 fL (ref 80.0–100.0)
Monocytes Absolute: 0.3 10*3/uL (ref 0.1–1.0)
Monocytes Relative: 7 %
Neutro Abs: 2.7 10*3/uL (ref 1.7–7.7)
Neutrophils Relative %: 61 %
Platelet Count: 184 10*3/uL (ref 150–400)
RBC: 4.48 MIL/uL (ref 4.22–5.81)
RDW: 12.4 % (ref 11.5–15.5)
WBC Count: 4.5 10*3/uL (ref 4.0–10.5)
nRBC: 0 % (ref 0.0–0.2)

## 2022-02-08 LAB — CMP (CANCER CENTER ONLY)
ALT: 15 U/L (ref 0–44)
AST: 20 U/L (ref 15–41)
Albumin: 4.7 g/dL (ref 3.5–5.0)
Alkaline Phosphatase: 47 U/L (ref 38–126)
Anion gap: 7 (ref 5–15)
BUN: 16 mg/dL (ref 6–20)
CO2: 33 mmol/L — ABNORMAL HIGH (ref 22–32)
Calcium: 10.3 mg/dL (ref 8.9–10.3)
Chloride: 102 mmol/L (ref 98–111)
Creatinine: 1.06 mg/dL (ref 0.61–1.24)
GFR, Estimated: >60 mL/min (ref >60)
Glucose, Bld: 102 mg/dL — ABNORMAL HIGH (ref 70–99)
Potassium: 4.5 mmol/L (ref 3.5–5.1)
Sodium: 142 mmol/L (ref 135–145)
Total Bilirubin: 1.1 mg/dL (ref 0.3–1.2)
Total Protein: 7 g/dL (ref 6.5–8.1)

## 2022-02-08 LAB — IRON AND IRON BINDING CAPACITY (CC-WL,HP ONLY)
Iron: 256 ug/dL — ABNORMAL HIGH (ref 45–182)
Saturation Ratios: 97 % — ABNORMAL HIGH (ref 17.9–39.5)
TIBC: 263 ug/dL (ref 250–450)
UIBC: 7 ug/dL — ABNORMAL LOW (ref 117–376)

## 2022-02-08 LAB — FERRITIN: Ferritin: 68 ng/mL (ref 24–336)

## 2022-02-08 NOTE — Progress Notes (Signed)
Hematology and Oncology Follow Up Visit  Keith Evans [id number] 1966-01-14 56 y.o. 02/08/2022   Principle Diagnosis:  Hemochromatosis --homozygous for C282Y  Current Therapy:   Phlebotomy to maintain ferritin less than 100     Interim History:  Keith Evans is back for follow-up.  We see him every 6 months.  So far, he has had no problems.  Has been quite busy with work.  He has a Associate Professor.  He is getting ready for the Market which is in 3 weeks.  He just got back from [place].  He is from [place].  He enjoys going back there.  He also has been in [place].  When we last saw him, his ferritin was 47 with an iron saturation of 98%.  He does have a quite a bit of iron avidity.  He has had no problems with cough or shortness of breath.  There is been no problems with COVID.    He is on injectable testosterone.  We will check a testosterone level on him.  He has had no rashes.  There is been no leg swelling.  He has had no headache.  Overall, I was his performance status is ECOG 0.    Medications:  Current Outpatient Medications:    b complex vitamins tablet, Take 1 tablet by mouth daily., Disp: , Rfl:    fluticasone (FLONASE) 50 MCG/ACT nasal spray, Place 2 sprays into both nostrils daily as needed., Disp: , Rfl:    guaiFENesin (MUCINEX) 600 MG 12 hr tablet, Take 600 mg by mouth as needed. , Disp: , Rfl:    Multiple Vitamin (MULTIVITAMIN) tablet, Take 1 tablet by mouth daily. 02/09/2021 Takes vitamin without iron and C., Disp: , Rfl:    Testosterone 20.25 MG/ACT (1.62%) GEL, SMARTSIG:2 Pump Topical Daily, Disp: , Rfl:    XYOSTED 75 MG/0.5ML SOAJ, Inject 75 mg into the skin once a week., Disp: , Rfl:   Allergies:  Allergies  Allergen Reactions   Propofol Other (See Comments)    Past Medical History, Surgical history, Social history, and Family History were reviewed and updated.  Review of Systems: Review of Systems  Constitutional: Negative.   HENT:  Negative.     Eyes: Negative.   Respiratory: Negative.    Cardiovascular: Negative.   Gastrointestinal: Negative.   Endocrine: Negative.   Genitourinary: Negative.    Musculoskeletal:  Positive for arthralgias.  Skin: Negative.   Neurological: Negative.   Hematological: Negative.   Psychiatric/Behavioral: Negative.      Physical Exam:  vitals were not taken for this visit.   Wt Readings from Last 3 Encounters:  08/10/21 173 lb (78.5 kg)  02/09/21 169 lb 1.3 oz (76.7 kg)  09/28/18 168 lb (76.2 kg)    Physical Exam Vitals reviewed.  HENT:     Head: Normocephalic and atraumatic.  Eyes:     Pupils: Pupils are equal, round, and reactive to light.  Cardiovascular:     Rate and Rhythm: Normal rate and regular rhythm.     Heart sounds: Normal heart sounds.  Pulmonary:     Effort: Pulmonary effort is normal.     Breath sounds: Normal breath sounds.  Abdominal:     General: Bowel sounds are normal.     Palpations: Abdomen is soft.  Musculoskeletal:        General: No tenderness or deformity. Normal range of motion.     Cervical back: Normal range of motion.  Lymphadenopathy:     Cervical: No  cervical adenopathy.  Skin:    General: Skin is warm and dry.     Findings: No erythema or rash.  Neurological:     Mental Status: He is alert and oriented to person, place, and time.  Psychiatric:        Behavior: Behavior normal.        Thought Content: Thought content normal.        Judgment: Judgment normal.      Lab Results  Component Value Date   WBC 4.5 02/08/2022   HGB 15.1 02/08/2022   HCT 43.6 02/08/2022   MCV 97.3 02/08/2022   PLT 184 02/08/2022     Chemistry      Component Value Date/Time   NA 141 08/10/2021 0802   K 5.1 08/10/2021 0802   CL 103 08/10/2021 0802   CO2 31 08/10/2021 0802   BUN 22 (H) 08/10/2021 0802   CREATININE 1.03 08/10/2021 0802      Component Value Date/Time   CALCIUM 10.4 (H) 08/10/2021 0802   ALKPHOS 58 08/10/2021 0802   AST 17 08/10/2021  0802   ALT 12 08/10/2021 0802   BILITOT 1.4 (H) 08/10/2021 0802      Impression and Plan: Keith Evans is a very nice 56 year old white male.  He has hemochromatosis.  He is homozygous for the major mutation.  We will see what his iron studies look like.  We really go by his ferritin.  Hopefully, he will have a very successful Market this year.  As always, we will plan to get him back in 6 months.   Volanda Napoleon, MD 9/18/20238:06 AM

## 2022-02-09 ENCOUNTER — Encounter: Payer: Self-pay | Admitting: *Deleted

## 2022-02-09 LAB — TESTOSTERONE: Testosterone: 397 ng/dL (ref 264–916)

## 2022-08-06 ENCOUNTER — Encounter: Payer: Self-pay | Admitting: Hematology & Oncology

## 2022-08-09 ENCOUNTER — Inpatient Hospital Stay: Payer: 59 | Attending: Hematology & Oncology

## 2022-08-09 ENCOUNTER — Telehealth: Payer: Self-pay

## 2022-08-09 ENCOUNTER — Inpatient Hospital Stay (HOSPITAL_BASED_OUTPATIENT_CLINIC_OR_DEPARTMENT_OTHER): Payer: 59 | Admitting: Hematology & Oncology

## 2022-08-09 ENCOUNTER — Encounter: Payer: Self-pay | Admitting: Hematology & Oncology

## 2022-08-09 LAB — IRON AND IRON BINDING CAPACITY (CC-WL,HP ONLY)
Iron: 244 ug/dL — ABNORMAL HIGH (ref 45–182)
Saturation Ratios: 95 % — ABNORMAL HIGH (ref 17.9–39.5)
TIBC: 258 ug/dL (ref 250–450)
UIBC: 14 ug/dL — ABNORMAL LOW (ref 117–376)

## 2022-08-09 LAB — CBC WITH DIFFERENTIAL (CANCER CENTER ONLY)
Abs Immature Granulocytes: 0.01 10*3/uL (ref 0.00–0.07)
Basophils Absolute: 0.1 10*3/uL (ref 0.0–0.1)
Basophils Relative: 1 %
Eosinophils Absolute: 0.2 10*3/uL (ref 0.0–0.5)
Eosinophils Relative: 3 %
HCT: 43.3 % (ref 39.0–52.0)
Hemoglobin: 15.2 g/dL (ref 13.0–17.0)
Immature Granulocytes: 0 %
Lymphocytes Relative: 27 %
Lymphs Abs: 1.5 10*3/uL (ref 0.7–4.0)
MCH: 33.6 pg (ref 26.0–34.0)
MCHC: 35.1 g/dL (ref 30.0–36.0)
MCV: 95.8 fL (ref 80.0–100.0)
Monocytes Absolute: 0.5 10*3/uL (ref 0.1–1.0)
Monocytes Relative: 9 %
Neutro Abs: 3.3 10*3/uL (ref 1.7–7.7)
Neutrophils Relative %: 60 %
Platelet Count: 180 10*3/uL (ref 150–400)
RBC: 4.52 MIL/uL (ref 4.22–5.81)
RDW: 12.6 % (ref 11.5–15.5)
WBC Count: 5.5 10*3/uL (ref 4.0–10.5)
nRBC: 0 % (ref 0.0–0.2)

## 2022-08-09 LAB — CMP (CANCER CENTER ONLY)
ALT: 15 U/L (ref 0–44)
AST: 20 U/L (ref 15–41)
Albumin: 4.8 g/dL (ref 3.5–5.0)
Alkaline Phosphatase: 48 U/L (ref 38–126)
Anion gap: 6 (ref 5–15)
BUN: 23 mg/dL — ABNORMAL HIGH (ref 6–20)
CO2: 33 mmol/L — ABNORMAL HIGH (ref 22–32)
Calcium: 10 mg/dL (ref 8.9–10.3)
Chloride: 101 mmol/L (ref 98–111)
Creatinine: 1.06 mg/dL (ref 0.61–1.24)
GFR, Estimated: >60 mL/min (ref >60)
Glucose, Bld: 96 mg/dL (ref 70–99)
Potassium: 4.6 mmol/L (ref 3.5–5.1)
Sodium: 140 mmol/L (ref 135–145)
Total Bilirubin: 1.4 mg/dL — ABNORMAL HIGH (ref 0.3–1.2)
Total Protein: 6.8 g/dL (ref 6.5–8.1)

## 2022-08-09 LAB — FERRITIN: Ferritin: 86 ng/mL (ref 24–336)

## 2022-08-09 NOTE — Progress Notes (Signed)
Hematology and Oncology Follow Up Visit  Keith Evans [id number] 1965/09/12 57 y.o. 08/09/2022   Principle Diagnosis:  Hemochromatosis --homozygous for C282Y  Current Therapy:   Phlebotomy to maintain ferritin less than 100     Interim History:  Keith Evans is back for follow-up.  We see him every 6 months.  The big news is that he and his husband just got a new puppy.  I saw pictures of her.  She is very cute.  I am sure they will have many happy years with her.  As always, he travels.  He did get back from [place].  He is at a convention out there.  He is still busy with a furniture company.  He is very busy with them.  This year, he will not have a set up at the [employer].  When we last saw him, his iron studies looked okay.  He was has a high iron saturation because of iron avidity.  His ferritin, is what we go by.  His last ferritin was 68.   There has been no problems with nausea or vomiting.  He is exercising.  He says his body fat is only 10.8%.  He has had no change in bowel or bladder habits.  He is on testosterone.  We will see what his level is.  He has had no problems with COVID.  Overall, I would say his performance status is probably ECOG 0.     Medications:  Current Outpatient Medications:    BD PLASTIPAK SYRINGE 21G X 1" 3 ML MISC, Inject into the muscle once a week., Disp: , Rfl:    Multiple Vitamin (MULTIVITAMIN) tablet, Take 1 tablet by mouth daily. 02/09/2021 Takes vitamin without iron and C., Disp: , Rfl:    testosterone cypionate (DEPOTESTOSTERONE CYPIONATE) 200 MG/ML injection, every 7 (seven) days., Disp: , Rfl:    tretinoin (RETIN-A) 0.1 % cream, Apply topically at bedtime., Disp: , Rfl:    fluticasone (FLONASE) 50 MCG/ACT nasal spray, Place 2 sprays into both nostrils daily as needed. (Patient not taking: Reported on 08/09/2022), Disp: , Rfl:    guaiFENesin (MUCINEX) 600 MG 12 hr tablet, Take 600 mg by mouth as needed.  (Patient not  taking: Reported on 08/09/2022), Disp: , Rfl:   Allergies:  Allergies  Allergen Reactions   Propofol Other (See Comments)    Hallucinations    Past Medical History, Surgical history, Social history, and Family History were reviewed and updated.  Review of Systems: Review of Systems  Constitutional: Negative.   HENT:  Negative.    Eyes: Negative.   Respiratory: Negative.    Cardiovascular: Negative.   Gastrointestinal: Negative.   Endocrine: Negative.   Genitourinary: Negative.    Musculoskeletal:  Positive for arthralgias.  Skin: Negative.   Neurological: Negative.   Hematological: Negative.   Psychiatric/Behavioral: Negative.      Physical Exam:  height is 5' 10.5" (1.791 m) and weight is 166 lb 12.8 oz (75.7 kg). His oral temperature is 97.5 F (36.4 C) (abnormal). His blood pressure is 136/61 and his pulse is 53 (abnormal). His respiration is 20 and oxygen saturation is 100%.   Wt Readings from Last 3 Encounters:  08/09/22 166 lb 12.8 oz (75.7 kg)  02/08/22 164 lb (74.4 kg)  08/10/21 173 lb (78.5 kg)    Physical Exam Vitals reviewed.  HENT:     Head: Normocephalic and atraumatic.  Eyes:     Pupils: Pupils are equal, round, and reactive to  light.  Cardiovascular:     Rate and Rhythm: Normal rate and regular rhythm.     Heart sounds: Normal heart sounds.  Pulmonary:     Effort: Pulmonary effort is normal.     Breath sounds: Normal breath sounds.  Abdominal:     General: Bowel sounds are normal.     Palpations: Abdomen is soft.  Musculoskeletal:        General: No tenderness or deformity. Normal range of motion.     Cervical back: Normal range of motion.  Lymphadenopathy:     Cervical: No cervical adenopathy.  Skin:    General: Skin is warm and dry.     Findings: No erythema or rash.  Neurological:     Mental Status: He is alert and oriented to person, place, and time.  Psychiatric:        Behavior: Behavior normal.        Thought Content: Thought  content normal.        Judgment: Judgment normal.     Lab Results  Component Value Date   WBC 5.5 08/09/2022   HGB 15.2 08/09/2022   HCT 43.3 08/09/2022   MCV 95.8 08/09/2022   PLT 180 08/09/2022     Chemistry      Component Value Date/Time   NA 142 02/08/2022 0748   K 4.5 02/08/2022 0748   CL 102 02/08/2022 0748   CO2 33 (H) 02/08/2022 0748   BUN 16 02/08/2022 0748   CREATININE 1.06 02/08/2022 0748      Component Value Date/Time   CALCIUM 10.3 02/08/2022 0748   ALKPHOS 47 02/08/2022 0748   AST 20 02/08/2022 0748   ALT 15 02/08/2022 0748   BILITOT 1.1 02/08/2022 0748      Impression and Plan: Keith Evans is a very nice 57 year old white male.  He has hemochromatosis.  He is homozygous for the major mutation.  We will see what his iron studies look like.  We really go by his ferritin.  His iron saturation always been on the high side because of iron avidity.  I am so happy that he and his husband have this new puppy.  I hope that they will have many heavy years with her.  As always, we will plan to get him back in 6 months.   Volanda Napoleon, MD 3/18/20248:12 AM

## 2022-08-09 NOTE — Telephone Encounter (Signed)
-----   Message from Volanda Napoleon, MD sent at 08/09/2022  2:40 PM EDT ----- Call - the ferritin is getting a little higher.  I think that we do need a phlebotomy on him since it will be 6 months until he comes back to see [place]!!  Laurey Arrow

## 2022-08-10 ENCOUNTER — Telehealth: Payer: Self-pay

## 2022-08-10 LAB — TESTOSTERONE: Testosterone: 982 ng/dL — ABNORMAL HIGH (ref 264–916)

## 2022-08-10 NOTE — Telephone Encounter (Signed)
Advised via MyChart.

## 2022-08-10 NOTE — Telephone Encounter (Signed)
-----   Message from Volanda Napoleon, MD sent at 08/10/2022  9:49 AM EDT ----- Call and let him know that the testosterone level is actually quite high at 982.  Thanks.  Laurey Arrow

## 2022-12-31 ENCOUNTER — Encounter: Payer: Self-pay | Admitting: Hematology & Oncology

## 2023-01-31 ENCOUNTER — Encounter: Payer: Self-pay | Admitting: Internal Medicine

## 2023-02-03 ENCOUNTER — Encounter: Payer: Self-pay | Admitting: Internal Medicine

## 2023-02-03 LAB — LAB REPORT - SCANNED: EGFR: 85

## 2023-02-07 ENCOUNTER — Encounter: Payer: Self-pay | Admitting: Hematology & Oncology

## 2023-02-07 ENCOUNTER — Inpatient Hospital Stay: Payer: Managed Care, Other (non HMO) | Attending: Hematology & Oncology | Admitting: Hematology & Oncology

## 2023-02-07 ENCOUNTER — Inpatient Hospital Stay: Payer: 59

## 2023-02-07 NOTE — Progress Notes (Signed)
Hematology and Oncology Follow Up Visit  Keith Evans [id number] 12-02-1965 57 y.o. 02/07/2023   Principle Diagnosis:  Hemochromatosis --homozygous for C282Y  Current Therapy:   Phlebotomy to maintain ferritin less than 100     Interim History:  Keith Evans is back for follow-up.  We see him every 6 months.  As always, he is busy working.  Now, he only has 1 job.  This is making life little bit easier for him.  He is going up to [place] to see a client I think in a week or so.  I am sure that he will have a good time.  He and his husband are both doing quite well.  They have a house in [place] that he likes to go to.  The puppy is now 87 months old.  I am sure that she is giving them a lot of joy.    He has had blood work recently.  He gets some blood done at [facility].  Apparently, his ferritin was 250.  His iron saturation was 93%.  He was has a high iron saturation.  He does do phlebotomies monthly.  He is able to donate blood.  His blood is very good for donation.  His testosterone levels also has been on high side.  He apparently sustained a injury while doing yoga.  Hopefully, he has gotten a bit better from this.  Has been no change in bowel or bladder habits.  He has had no cough or shortness of breath.  He has had no problems with COVID.  Overall, I would say that his performance status prior ECOG 0.        Medications:  Current Outpatient Medications:    BD PLASTIPAK SYRINGE 21G X 1" 3 ML MISC, Inject into the muscle once a week., Disp: , Rfl:    Multiple Vitamin (MULTIVITAMIN) tablet, Take 1 tablet by mouth daily. 02/09/2021 Takes vitamin without iron and C., Disp: , Rfl:    psyllium (REGULOID) 0.52 g capsule, Take 0.52 g by mouth daily., Disp: , Rfl:    testosterone cypionate (DEPOTESTOSTERONE CYPIONATE) 200 MG/ML injection, 150 mg every 7 (seven) days., Disp: , Rfl:    tretinoin (RETIN-A) 0.1 % cream, Apply topically at bedtime., Disp: , Rfl:     fluticasone (FLONASE) 50 MCG/ACT nasal spray, Place 2 sprays into both nostrils daily as needed. (Patient not taking: Reported on 02/07/2023), Disp: , Rfl:   Allergies:  Allergies  Allergen Reactions   Propofol Other (See Comments)    Hallucinations    Past Medical History, Surgical history, Social history, and Family History were reviewed and updated.  Review of Systems: Review of Systems  Constitutional: Negative.   HENT:  Negative.    Eyes: Negative.   Respiratory: Negative.    Cardiovascular: Negative.   Gastrointestinal: Negative.   Endocrine: Negative.   Genitourinary: Negative.    Musculoskeletal:  Positive for arthralgias.  Skin: Negative.   Neurological: Negative.   Hematological: Negative.   Psychiatric/Behavioral: Negative.      Physical Exam:  height is 5\' 11"  (1.803 m) and weight is 163 lb (73.9 kg). His oral temperature is 97.8 F (36.6 C). His blood pressure is 130/61 and his pulse is 59 (abnormal). His respiration is 16 and oxygen saturation is 100%.   Wt Readings from Last 3 Encounters:  02/07/23 163 lb (73.9 kg)  08/09/22 166 lb 12.8 oz (75.7 kg)  02/08/22 164 lb (74.4 kg)    Physical Exam Vitals  reviewed.  HENT:     Head: Normocephalic and atraumatic.  Eyes:     Pupils: Pupils are equal, round, and reactive to light.  Cardiovascular:     Rate and Rhythm: Normal rate and regular rhythm.     Heart sounds: Normal heart sounds.  Pulmonary:     Effort: Pulmonary effort is normal.     Breath sounds: Normal breath sounds.  Abdominal:     General: Bowel sounds are normal.     Palpations: Abdomen is soft.  Musculoskeletal:        General: No tenderness or deformity. Normal range of motion.     Cervical back: Normal range of motion.  Lymphadenopathy:     Cervical: No cervical adenopathy.  Skin:    General: Skin is warm and dry.     Findings: No erythema or rash.  Neurological:     Mental Status: He is alert and oriented to person, place, and time.   Psychiatric:        Behavior: Behavior normal.        Thought Content: Thought content normal.        Judgment: Judgment normal.      Lab Results  Component Value Date   WBC 5.5 08/09/2022   HGB 15.2 08/09/2022   HCT 43.3 08/09/2022   MCV 95.8 08/09/2022   PLT 180 08/09/2022     Chemistry      Component Value Date/Time   NA 140 08/09/2022 0742   K 4.6 08/09/2022 0742   CL 101 08/09/2022 0742   CO2 33 (H) 08/09/2022 0742   BUN 23 (H) 08/09/2022 0742   CREATININE 1.06 08/09/2022 0742      Component Value Date/Time   CALCIUM 10.0 08/09/2022 0742   ALKPHOS 48 08/09/2022 0742   AST 20 08/09/2022 0742   ALT 15 08/09/2022 0742   BILITOT 1.4 (H) 08/09/2022 0742      Impression and Plan: Keith Evans is a very nice 57 year old white male.  He has hemochromatosis.  He is homozygous for the major mutation.  As always, his iron studies are was on the high side.  Getting phlebotomized quite often.  I have no problem with him continuing to phlebotomy.  I think some of the ferritin elevation might be from this injury that he sustained.  He is very diligent with watching his iron studies.  As always, we will see him back in 6 months.    [name], MD 9/16/20248:05 AM

## 2023-02-09 ENCOUNTER — Telehealth: Payer: Self-pay | Admitting: Oncology

## 2023-02-09 NOTE — Telephone Encounter (Signed)
Contacted pt to schedule an appt. Unable to reach via phone, voicemail was left.    Follow-Up Information  Follow-up disposition: Return in about 6 months (around 08/07/2023) for md labs, appt.  Check out comments: He gets labs done at [facility].

## 2023-06-16 DIAGNOSIS — G500 Trigeminal neuralgia: Secondary | ICD-10-CM | POA: Diagnosis not present

## 2023-06-17 ENCOUNTER — Other Ambulatory Visit (HOSPITAL_COMMUNITY): Payer: Self-pay | Admitting: Neuroradiology

## 2023-06-20 DIAGNOSIS — G500 Trigeminal neuralgia: Secondary | ICD-10-CM | POA: Diagnosis not present

## 2023-06-22 DIAGNOSIS — G501 Atypical facial pain: Secondary | ICD-10-CM | POA: Diagnosis not present

## 2023-07-20 DIAGNOSIS — M65872 Other synovitis and tenosynovitis, left ankle and foot: Secondary | ICD-10-CM | POA: Diagnosis not present

## 2023-07-20 DIAGNOSIS — M898X7 Other specified disorders of bone, ankle and foot: Secondary | ICD-10-CM | POA: Diagnosis not present

## 2023-07-20 DIAGNOSIS — M792 Neuralgia and neuritis, unspecified: Secondary | ICD-10-CM | POA: Diagnosis not present

## 2023-08-10 DIAGNOSIS — M19071 Primary osteoarthritis, right ankle and foot: Secondary | ICD-10-CM | POA: Diagnosis not present

## 2023-08-24 DIAGNOSIS — M79652 Pain in left thigh: Secondary | ICD-10-CM | POA: Diagnosis not present

## 2023-08-24 DIAGNOSIS — M79662 Pain in left lower leg: Secondary | ICD-10-CM | POA: Diagnosis not present

## 2023-08-24 DIAGNOSIS — M79651 Pain in right thigh: Secondary | ICD-10-CM | POA: Diagnosis not present

## 2023-08-24 DIAGNOSIS — M79661 Pain in right lower leg: Secondary | ICD-10-CM | POA: Diagnosis not present

## 2023-10-11 DIAGNOSIS — E610 Copper deficiency: Secondary | ICD-10-CM | POA: Diagnosis not present

## 2023-10-11 DIAGNOSIS — M2170 Unequal limb length (acquired), unspecified site: Secondary | ICD-10-CM | POA: Diagnosis not present

## 2023-10-11 DIAGNOSIS — E291 Testicular hypofunction: Secondary | ICD-10-CM | POA: Diagnosis not present

## 2023-10-20 DIAGNOSIS — E291 Testicular hypofunction: Secondary | ICD-10-CM | POA: Diagnosis not present

## 2023-10-20 DIAGNOSIS — E7212 Methylenetetrahydrofolate reductase deficiency: Secondary | ICD-10-CM | POA: Diagnosis not present

## 2023-10-20 DIAGNOSIS — E610 Copper deficiency: Secondary | ICD-10-CM | POA: Diagnosis not present

## 2024-05-25 ENCOUNTER — Other Ambulatory Visit: Payer: Self-pay | Admitting: Family Medicine

## 2024-05-25 DIAGNOSIS — E785 Hyperlipidemia, unspecified: Secondary | ICD-10-CM

## 2024-05-26 ENCOUNTER — Encounter: Payer: Self-pay | Admitting: Internal Medicine

## 2024-05-29 ENCOUNTER — Encounter: Payer: Self-pay | Admitting: Internal Medicine

## 2024-05-29 ENCOUNTER — Inpatient Hospital Stay
Admission: RE | Admit: 2024-05-29 | Discharge: 2024-05-29 | Payer: Self-pay | Attending: Family Medicine | Admitting: Family Medicine

## 2024-05-29 DIAGNOSIS — E785 Hyperlipidemia, unspecified: Secondary | ICD-10-CM
